# Patient Record
Sex: Male | Born: 1950
Health system: Southern US, Community
[De-identification: ages and names within clinical notes are randomized; demographics above are authoritative.]

## PROBLEM LIST (undated history)

## (undated) DIAGNOSIS — N200 Calculus of kidney: Secondary | ICD-10-CM

## (undated) DIAGNOSIS — I2510 Atherosclerotic heart disease of native coronary artery without angina pectoris: Secondary | ICD-10-CM

## (undated) DIAGNOSIS — Q2381 Bicuspid aortic valve: Secondary | ICD-10-CM

## (undated) DIAGNOSIS — R972 Elevated prostate specific antigen [PSA]: Secondary | ICD-10-CM

## (undated) DIAGNOSIS — E785 Hyperlipidemia, unspecified: Secondary | ICD-10-CM

## (undated) DIAGNOSIS — I10 Essential (primary) hypertension: Secondary | ICD-10-CM

## (undated) DIAGNOSIS — H269 Unspecified cataract: Secondary | ICD-10-CM

## (undated) HISTORY — DX: Hyperlipidemia, unspecified: E78.5

## (undated) HISTORY — DX: Elevated prostate specific antigen (PSA): R97.20

## (undated) HISTORY — PX: TONSILLECTOMY: SUR1361

## (undated) HISTORY — DX: Atherosclerotic heart disease of native coronary artery without angina pectoris: I25.10

## (undated) HISTORY — PX: EYE SURGERY: SHX253

## (undated) HISTORY — DX: Unspecified cataract: H26.9

## (undated) HISTORY — DX: Calculus of kidney: N20.0

## (undated) HISTORY — DX: Essential (primary) hypertension: I10

## (undated) HISTORY — DX: Bicuspid aortic valve: Q23.81

---

## 2003-12-26 ENCOUNTER — Inpatient Hospital Stay (HOSPITAL_BASED_OUTPATIENT_CLINIC_OR_DEPARTMENT_OTHER): Admission: RE | Admit: 2003-12-26 | Discharge: 2003-12-26 | Payer: Self-pay | Admitting: Cardiovascular Disease

## 2009-01-01 ENCOUNTER — Ambulatory Visit: Payer: Self-pay | Admitting: Family Medicine

## 2009-01-01 DIAGNOSIS — E785 Hyperlipidemia, unspecified: Secondary | ICD-10-CM

## 2009-01-01 DIAGNOSIS — I2510 Atherosclerotic heart disease of native coronary artery without angina pectoris: Secondary | ICD-10-CM | POA: Insufficient documentation

## 2009-01-01 DIAGNOSIS — R972 Elevated prostate specific antigen [PSA]: Secondary | ICD-10-CM

## 2009-01-01 HISTORY — DX: Elevated prostate specific antigen (PSA): R97.20

## 2009-01-01 HISTORY — DX: Hyperlipidemia, unspecified: E78.5

## 2009-01-02 LAB — CONVERTED CEMR LAB
ALT: 28 units/L (ref 0–53)
GFR calc non Af Amer: 92.76 mL/min (ref >60)
Glucose, Bld: 95 mg/dL (ref 70–99)
HDL: 27.8 mg/dL — ABNORMAL LOW (ref >39.00)
Potassium: 5.1 meq/L (ref 3.5–5.1)
Sodium: 141 meq/L (ref 135–145)
Total Bilirubin: 1.4 mg/dL — ABNORMAL HIGH (ref 0.3–1.2)
Total CHOL/HDL Ratio: 4
VLDL: 25 mg/dL (ref 0.0–40.0)

## 2009-12-26 ENCOUNTER — Ambulatory Visit: Payer: Self-pay | Admitting: Family Medicine

## 2009-12-26 LAB — CONVERTED CEMR LAB
BUN: 20 mg/dL (ref 6–23)
Bilirubin Urine: NEGATIVE
Bilirubin, Direct: 0.1 mg/dL (ref 0.0–0.3)
CO2: 27 meq/L (ref 19–32)
Chloride: 102 meq/L (ref 96–112)
Cholesterol: 130 mg/dL (ref 0–200)
Creatinine, Ser: 1 mg/dL (ref 0.4–1.5)
Eosinophils Absolute: 0.3 10*3/uL (ref 0.0–0.7)
Glucose, Bld: 101 mg/dL — ABNORMAL HIGH (ref 70–99)
Ketones, urine, test strip: NEGATIVE
LDL Cholesterol: 71 mg/dL (ref 0–99)
MCHC: 34.5 g/dL (ref 30.0–36.0)
MCV: 92.7 fL (ref 78.0–100.0)
Monocytes Absolute: 0.6 10*3/uL (ref 0.1–1.0)
Neutrophils Relative %: 72.8 % (ref 43.0–77.0)
PSA: 3.99 ng/mL (ref 0.10–4.00)
Platelets: 173 10*3/uL (ref 150.0–400.0)
RDW: 12.8 % (ref 11.5–14.6)
Specific Gravity, Urine: 1.02
TSH: 2.14 microintl units/mL (ref 0.35–5.50)
Total Bilirubin: 0.5 mg/dL (ref 0.3–1.2)
Triglycerides: 127 mg/dL (ref 0.0–149.0)
Urobilinogen, UA: 0.2

## 2010-01-02 ENCOUNTER — Ambulatory Visit: Payer: Self-pay | Admitting: Family Medicine

## 2010-01-03 ENCOUNTER — Telehealth: Payer: Self-pay | Admitting: Family Medicine

## 2010-01-21 ENCOUNTER — Encounter: Payer: Self-pay | Admitting: Family Medicine

## 2010-01-30 ENCOUNTER — Encounter: Payer: Self-pay | Admitting: Gastroenterology

## 2010-02-28 ENCOUNTER — Encounter (INDEPENDENT_AMBULATORY_CARE_PROVIDER_SITE_OTHER): Payer: Self-pay | Admitting: *Deleted

## 2010-03-03 ENCOUNTER — Ambulatory Visit: Payer: Self-pay | Admitting: Gastroenterology

## 2010-03-10 LAB — HM COLONOSCOPY

## 2010-03-17 ENCOUNTER — Ambulatory Visit: Payer: Self-pay | Admitting: Gastroenterology

## 2010-06-25 NOTE — Letter (Signed)
Summary: [facility] Instructions  [facility]  [address] [place], [place] [address]   Phone: [phone]  Fax: [phone]       Eugene Daniel    August 07, 1950    MRN: [id number]        Procedure Day Dorna Bloom:  Duanne Limerick  03/17/10     Arrival Time:  9:30AM     Procedure Time:  10:30AM     Location of Procedure:                    _X _  [facility] (4th Floor)                       PREPARATION FOR COLONOSCOPY WITH MOVIPREP   Starting 5 days prior to your procedure 03/12/10 do not eat nuts, seeds, popcorn, corn, beans, peas,  salads, or any raw vegetables.  Do not take any fiber supplements (e.g. Metamucil, Citrucel, and Benefiber).  THE DAY BEFORE YOUR PROCEDURE         DATE: 03/16/10  DAY: SUNDAY  1.  Drink clear liquids the entire day-NO SOLID FOOD  2.  Do not drink anything colored red or purple.  Avoid juices with pulp.  No orange juice.  3.  Drink at least 64 oz. (8 glasses) of fluid/clear liquids during the day to prevent dehydration and help the prep work efficiently.  CLEAR LIQUIDS INCLUDE: Water Jello Ice Popsicles Tea (sugar ok, no milk/cream) Powdered fruit flavored drinks Coffee (sugar ok, no milk/cream) Gatorade Juice: apple, white grape, white cranberry  Lemonade Clear bullion, consomm, broth Carbonated beverages (any kind) Strained chicken noodle soup Hard Candy                             4.  In the morning, mix first dose of MoviPrep solution:    Empty 1 Pouch A and 1 Pouch B into the disposable container    Add lukewarm drinking water to the top line of the container. Mix to dissolve    Refrigerate (mixed solution should be used within 24 hrs)  5.  Begin drinking the prep at 5:00 p.m. The MoviPrep container is divided by 4 marks.   Every 15 minutes drink the solution down to the next mark (approximately 8 oz) until the full liter is complete.   6.  Follow completed prep with 16 oz of clear liquid of your choice  (Nothing red or purple).  Continue to drink clear liquids until bedtime.  7.  Before going to bed, mix second dose of MoviPrep solution:    Empty 1 Pouch A and 1 Pouch B into the disposable container    Add lukewarm drinking water to the top line of the container. Mix to dissolve    Refrigerate  THE DAY OF YOUR PROCEDURE      DATE: 03/17/10  DAY: MONDAY  Beginning at 5:30AM (5 hours before procedure):         1. Every 15 minutes, drink the solution down to the next mark (approx 8 oz) until the full liter is complete.  2. Follow completed prep with 16 oz. of clear liquid of your choice.    3. You may drink clear liquids until 8:30AM (2 HOURS BEFORE PROCEDURE).   MEDICATION INSTRUCTIONS  Unless otherwise instructed, you should take regular prescription medications with a small sip of water   as early as possible the morning of  your procedure.    Additional medication instructions: n/a         OTHER INSTRUCTIONS  You will need a responsible adult at least 58 years of age to accompany you and drive you home.   This person must remain in the waiting room during your procedure.  Wear loose fitting clothing that is easily removed.  Leave jewelry and other valuables at home.  However, you may wish to bring a book to read or  an iPod/MP3 player to listen to music as you wait for your procedure to start.  Remove all body piercing jewelry and leave at home.  Total time from sign-in until discharge is approximately 2-3 hours.  You should go home directly after your procedure and rest.  You can resume normal activities the  day after your procedure.  The day of your procedure you should not:   Drive   Make legal decisions   Operate machinery   Drink alcohol   Return to work  You will receive specific instructions about eating, activities and medications before you leave.    The above instructions have been reviewed and explained to me by   Sherren Kerns RN   March 03, 2010 3:24 PM    I fully understand and can verbalize these instructions _____________________________ Date _________

## 2010-06-25 NOTE — Letter (Signed)
Summary: Pre Visit Letter Revised  [facility]  [address] [place], [place] [address]   Phone: [phone]  Fax: [phone]        01/30/2010 MRN: [id number] Eugene Daniel [address] [place], [place]  [address]             Procedure Date:  Oct 24 at 10:30am   Welcome to the Gastroenterology Division at [facility].    You are scheduled to see a nurse for your pre-procedure visit on Mar 03, 2010 at 3pm on the 3rd floor at [facility], [address]  We ask that you try to arrive at our office 15 minutes prior to your appointment time to allow for check-in.  Please take a minute to review the attached form.  If you answer "Yes" to one or more of the questions on the first page, we ask that you call the person listed at your earliest opportunity.  If you answer "No" to all of the questions, please complete the rest of the form and bring it to your appointment.    Your nurse visit will consist of discussing your medical and surgical history, your immediate family medical history, and your medications.   If you are unable to list all of your medications on the form, please bring the medication bottles to your appointment and we will list them.  We will need to be aware of both prescribed and over the counter drugs.  We will need to know exact dosage information as well.    Please be prepared to read and sign documents such as consent forms, a financial agreement, and acknowledgement forms.  If necessary, and with your consent, a friend or relative is welcome to sit-in on the nurse visit with you.  Please bring your insurance card so that we may make a copy of it.  If your insurance requires a referral to see a specialist, please bring your referral form from your primary care physician.  No co-pay is required for this nurse visit.     If you cannot keep your appointment, please call [phone] to cancel or reschedule prior to your appointment date.  This  allows [place] the opportunity to schedule an appointment for another patient in need of care.    Thank you for choosing  Gastroenterology for your medical needs.  We appreciate the opportunity to care for you.  Please visit [place] at our website  to learn more about our practice.  Sincerely, The Gastroenterology Division

## 2010-06-25 NOTE — Progress Notes (Signed)
Summary: 27 DAY RX  MAILORDER  Phone Note Refill Request Call back at [phone] Message from:  Patient  Refills Requested: Medication #1:  TOPROL XL 50 MG XR24H-TAB once daily  Medication #2:  VYTORIN 10-20 MG TABS once daily. PT NEEDS 90 DAY SUPPLY WITH 3 REFILLS FOR MAIL ORDER. PT WILL PICK UP RXS PLEASE CALL PT WHEN READY FOR PICK UP.  Initial call taken by: Heron Sabins,  January 03, 2010 9:46 AM  Follow-up for Phone Call        okay to reprint for 90 days supply with Refills? Follow-up by: Duard Brady LPN,  January 03, 2010 10:14 AM  Additional Follow-up for Phone Call Additional follow up Details #1::        Yes. Additional Follow-up by: Evelena Peat MD,  January 03, 2010 1:09 PM    Additional Follow-up for Phone Call Additional follow up Details #2::    printed new 90 days rx's - pt aware ready for pick up. KIK Follow-up by: Duard Brady LPN,  January 03, 2010 1:37 PM  Prescriptions: VYTORIN 10-20 MG TABS (EZETIMIBE-SIMVASTATIN) once daily  #90 x 3   Entered by:   Duard Brady LPN   Authorized by:   Evelena Peat MD   Signed by:   Duard Brady LPN on 45/40/9811   Method used:   Print then Give to Patient   RxID:   [id number] TOPROL XL 50 MG XR24H-TAB (METOPROLOL SUCCINATE) once daily  #90 x 3   Entered by:   Duard Brady LPN   Authorized by:   Evelena Peat MD   Signed by:   Duard Brady LPN on 78/46/9629   Method used:   Print then Give to Patient   RxID:   [id number]

## 2010-06-25 NOTE — Miscellaneous (Signed)
Summary: previsit prep/rm  Clinical Lists Changes  Medications: Added new medication of MOVIPREP 100 GM  SOLR (PEG-KCL-NACL-NASULF-NA ASC-C) As per prep instructions. - Signed Rx of MOVIPREP 100 GM  SOLR (PEG-KCL-NACL-NASULF-NA ASC-C) As per prep instructions.;  #1 x 0;  Signed;  Entered by: Sherren Kerns RN;  Authorized by: Mardella Layman MD [facility];  Method used: [phone] [address], [place], [place], [place]  [address], Ph: [phone] or [phone], Fax: [phone] Observations: Added new observation of ALLERGY REV: Done (03/03/2010 14:48)    Prescriptions: MOVIPREP 100 GM  SOLR (PEG-KCL-NACL-NASULF-NA ASC-C) As per prep instructions.  #1 x 0   Entered by:   Sherren Kerns RN   Authorized by:   Mardella Layman MD [facility]   Signed by:   Sherren Kerns RN on 03/03/2010   Method used:   [facility]  [address] [phone][facility]ail[facility]       [address]       [place], [place]  [address]       Ph: [phone] or [phone]       Fax: [phone]   RxID:   [id number]

## 2010-06-25 NOTE — Consult Note (Signed)
Summary: [facility]   Imported By: Sherian Rein 01/29/2010 10:41:35  _____________________________________________________________________  External Attachment:    Type:   Image     Comment:   External Document

## 2010-06-25 NOTE — Assessment & Plan Note (Signed)
Summary: cpx/cjr   Vital Signs:  Patient profile:   60 year old male Height:      71.75 inches Weight:      210 pounds BMI:     28.78 Temp:     98.6 degrees F oral Pulse rate:   78 / minute BP sitting:   110 / 70  (left arm) Cuff size:   regular  Vitals Entered By: Romualdo Bolk, CMA (AAMA) (January 02, 2010 1:22 PM)  Nutrition Counseling: Patient's BMI is greater than 25 and therefore counseled on weight management options. CC: CPX   History of Present Illness: Here for CPE.  Hx of CAD.  NO recent chest pain.  Not exercising. Unfortunately, still smoking some but less than 10 cigarettes/day. Last tetanus unknown.  Cannot confirm prior pneumovax. Had colonoscopy prior to move here but about 10 years ago-he does not recall any abnormality.   Preventive Screening-Counseling & Management  Alcohol-Tobacco     Alcohol drinks/day: 1     Alcohol type: spirits     Smoking Status: current     Packs/Day: 0.25  Caffeine-Diet-Exercise     Caffeine use/day: 5     Does Patient Exercise: no  Clinical Review Panels:  Prevention   Last PSA:  3.99 (12/26/2009)  Lipid Management   Cholesterol:  130 (12/26/2009)   LDL (bad choesterol):  71 (12/26/2009)   HDL (good cholesterol):  33.90 (12/26/2009)  CBC   WBC:  9.9 (12/26/2009)   RBC:  4.85 (12/26/2009)   Hgb:  15.5 (12/26/2009)   Hct:  45.0 (12/26/2009)   Platelets:  173.0 (12/26/2009)   MCV  92.7 (12/26/2009)   MCHC  34.5 (12/26/2009)   RDW  12.8 (12/26/2009)   PMN:  72.8 (12/26/2009)   Lymphs:  17.8 (12/26/2009)   Monos:  6.0 (12/26/2009)   Eosinophils:  2.9 (12/26/2009)   Basophil:  0.5 (12/26/2009)  Complete Metabolic Panel   Glucose:  101 (12/26/2009)   Sodium:  138 (12/26/2009)   Potassium:  4.5 (12/26/2009)   Chloride:  102 (12/26/2009)   CO2:  27 (12/26/2009)   BUN:  20 (12/26/2009)   Creatinine:  1.0 (12/26/2009)   Albumin:  4.1 (12/26/2009)   Total Protein:  6.8 (12/26/2009)   Calcium:  9.2  (12/26/2009)   Total Bili:  0.5 (12/26/2009)   Alk Phos:  59 (12/26/2009)   SGPT (ALT):  28 (12/26/2009)   SGOT (AST):  30 (12/26/2009)   Current Medications (verified): 1)  Toprol Xl 50 Mg Xr24h-Tab (Metoprolol Succinate) .... Once Daily 2)  Vytorin 10-20 Mg Tabs (Ezetimibe-Simvastatin) .... Once Daily  Allergies (verified): No Known Drug Allergies  Past History:  Past Medical History: Last updated: 01/01/2009 Kidney stones Coronary artery disease Hyperlipidemia  Past Surgical History: Last updated: 01/01/2009 Tonsillectomy  Family History: Last updated: 01/01/2009 CAD Father and grandfather 55 Grandmother leukemia  Social History: Last updated: 01/01/2009 Occupation: Married Alcohol use-yes Current Smoker  Risk Factors: Alcohol Use: 1 (01/02/2010) Caffeine Use: 5 (01/02/2010) Exercise: no (01/02/2010)  Risk Factors: Smoking Status: current (01/02/2010) Packs/Day: 0.25 (01/02/2010) PMH-FH-SH reviewed for relevance  Social History: Packs/Day:  0.25 Caffeine use/day:  5 Does Patient Exercise:  no  Review of Systems  The patient denies anorexia, fever, weight loss, weight gain, vision loss, chest pain, syncope, dyspnea on exertion, peripheral edema, prolonged cough, headaches, hemoptysis, abdominal pain, melena, hematochezia, severe indigestion/heartburn, hematuria, incontinence, muscle weakness, suspicious skin lesions, depression, enlarged lymph nodes, and testicular masses.    Physical Exam  General:  Well-developed,well-nourished,in no acute distress; alert,appropriate and cooperative throughout examination Head:  Normocephalic and atraumatic without obvious abnormalities. No apparent alopecia or balding. Eyes:  pupils equal, pupils round, and pupils reactive to light.   Ears:  External ear exam shows no significant lesions or deformities.  Otoscopic examination reveals clear canals, tympanic membranes are intact bilaterally without bulging, retraction,  inflammation or discharge. Hearing is grossly normal bilaterally. Mouth:  Oral mucosa and oropharynx without lesions or exudates.  Teeth in good repair. Neck:  No deformities, masses, or tenderness noted. Lungs:  Normal respiratory effort, chest expands symmetrically. Lungs are clear to auscultation, no crackles or wheezes. Heart:  normal rate, regular rhythm, and no gallop.   Abdomen:  soft, non-tender, normal bowel sounds, no distention, no masses, no abdominal hernia, no hepatomegaly, and no splenomegaly.   Rectal:  No external abnormalities noted. Normal sphincter tone. No rectal masses or tenderness. Genitalia:  Testes bilaterally descended without nodularity, tenderness or masses. No scrotal masses or lesions. No penis lesions or urethral discharge. Prostate:  Prostate gland firm and smooth, no enlargement, nodularity, tenderness, mass, asymmetry or induration. Msk:  No deformity or scoliosis noted of thoracic or lumbar spine.   Extremities:  No clubbing, cyanosis, edema, or deformity noted with normal full range of motion of all joints.   Neurologic:  alert & oriented X3, cranial nerves II-XII intact, strength normal in all extremities, and gait normal.   Skin:  no rashes and no suspicious lesions.   Cervical Nodes:  No lymphadenopathy noted Psych:  normally interactive, good eye contact, not anxious appearing, and not depressed appearing.     Impression & Recommendations:  Problem # 1:  Preventive Health Care (ICD-V70.0) Needs to quit smoking.  Labs reviewed.  Needs more consistent exercise.  Refer for repeat colonoscopy. Tdap and pneumovax given.  Problem # 2:  ELEVATED PROSTATE SPECIFIC ANTIGEN (ICD-790.93)  1.37 last year to current value of 3.99.  No nodules palpated.  Rec urology referral and pt agrees.  Orders: Urology Referral (Urology)  Complete Medication List: 1)  Toprol Xl 50 Mg Xr24h-tab (Metoprolol succinate) .... Once daily 2)  Vytorin 10-20 Mg Tabs  (Ezetimibe-simvastatin) .... Once daily  Other Orders: Tdap => 19yrs IM ([address]147) Pneumococcal Vaccine (82956) Admin 1st Vaccine (21308) Admin of Any Addtl Vaccine (65784) Gastroenterology Referral (GI)  Patient Instructions: 1)  Stop smoking tips: Choose a quit date. Cut down before the quit date. Decide what you will do as a substitute when you feel the urge to smoke(gum, toothpick, exercise).  2)  It is important that you exercise reguarly at least 20 minutes 5 times a week. If you develop chest pain, have severe difficulty breathing, or feel very tired, stop exercising immediately and seek medical attention.  Prescriptions: VYTORIN 10-20 MG TABS (EZETIMIBE-SIMVASTATIN) once daily  #30 x 11   Entered and Authorized by:   Evelena Peat MD   Signed by:   Evelena Peat MD on 01/02/2010   Method used:   [facility]  [address] [phone][facility]       [address]       [place], [place]  [address]       Ph: [phone] or [phone]       Fax: [phone]   RxID:   [id number] TOPROL XL 50 MG XR24H-TAB (METOPROLOL SUCCINATE) once daily  #30 x 11   Entered and Authorized by:  Evelena Peat MD   Signed by:   Evelena Peat MD on 01/02/2010   Method used:   [facility]        [facility]  [address] [phone][facility]       [address] [place], [place]  [address]       Ph: [phone] or [phone]       Fax: [phone]   RxID:   [id number]    Immunizations Administered:  Tetanus Vaccine:    Vaccine Type: Tdap    Site: left deltoid    Mfr: GlaxoSmithKline    Dose: 0.5 ml    Route: IM    Given by: Romualdo Bolk, CMA (AAMA)    Exp. Date: 11/22/2011    Lot #: [id number]  Pneumonia Vaccine:    Vaccine Type: Pneumovax    Site: right deltoid    Mfr: Merck    Dose: 0.5 ml    Route: IM    Given by: Romualdo Bolk, CMA (AAMA)    Exp. Date: 05/24/2011    Lot #: [id number]

## 2010-06-25 NOTE — Procedures (Signed)
Summary: Colonoscopy  Patient: Eugene Daniel Note: All result statuses are Final unless otherwise noted.  Tests: (1) Colonoscopy (COL)   COL Colonoscopy           DONE     [facility]     [address]     [place], [place]  [address]           COLONOSCOPY PROCEDURE REPORT           PATIENT:  Eugene, Daniel  MR#:  [id number]     BIRTHDATE:  07/02/1950, 59 yrs. old  GENDER:  male     ENDOSCOPIST:  Vania Rea. Jarold Motto, MD, [facility]     REF. BY:  Evelena Peat, M.D.     PROCEDURE DATE:  03/17/2010     PROCEDURE:  Average-risk screening colonoscopy     G0121     ASA CLASS:  Class II     INDICATIONS:  Routine Risk Screening     MEDICATIONS:   Fentanyl 75 mcg IV, Versed 7 mg IV           DESCRIPTION OF PROCEDURE:   After the risks benefits and     alternatives of the procedure were thoroughly explained, informed     consent was obtained.  Digital rectal exam was performed and     revealed no abnormalities.   The LB 180AL [id number] endoscope was     introduced through the anus and advanced to the cecum, which was     identified by both the appendix and ileocecal valve, without     limitations.  The quality of the prep was poor, using MoviPrep.     The instrument was then slowly withdrawn as the colon was fully     examined.     <<PROCEDUREIMAGES>>           FINDINGS:  Severe diverticulosis was found in the sigmoid to     descending colon segments.  No polyps or cancers were seen.  This     was otherwise a normal examination of the colon.   Retroflexed     views in the rectum revealed no abnormalities.    The scope was     then withdrawn from the patient and the procedure completed.           COMPLICATIONS:  None     ENDOSCOPIC IMPRESSION:     1) Severe diverticulosis in the sigmoid to descending colon     segments     2) No polyps or cancers     3) Otherwise normal examination     RECOMMENDATIONS:     1) Continue current colorectal screening recommendations for  "routine risk" patients with a repeat colonoscopy in 10 years.     REPEAT EXAM:  No           ______________________________     Vania Rea. Jarold Motto, MD, [facility]           CC:           n.     eSIGNED:   Vania Rea. Patterson at 03/17/2010 10:53 AM           Wyatt Mage, [id number]  Note: An exclamation mark (!) indicates a result that was not dispersed into the flowsheet. Document Creation Date: 03/17/2010 10:53 AM _______________________________________________________________________  (1) Order result status: Final Collection or observation date-time: 03/17/2010 10:45 Requested date-time:  Receipt date-time:  Reported date-time:  Referring Physician:   Ordering Physician: Sheryn Bison [phone])  Specimen Source:  Source: Launa Grill Order Number: [id number] Lab site:   Appended Document: Colonoscopy    Clinical Lists Changes  Observations: Added new observation of COLONNXTDUE: 02/2020 (03/17/2010 13:47)

## 2010-10-10 NOTE — Cardiovascular Report (Signed)
NAMEJULIA, KULZER                         ACCOUNT NO.:  [id number]   MEDICAL RECORD NO.:  [id number]                   PATIENT TYPE:  OIB   LOCATION:  6501                                 FACILITY:  [facility]   PHYSICIAN:  Vesta Mixer, M.D.              DATE OF BIRTH:  [birth year]   DATE OF PROCEDURE:  12/26/2003  DATE OF DISCHARGE:                              CARDIAC CATHETERIZATION   Mr. Bures is a 60 year old gentleman with a history of heart murmur.  He  was found to have aortic insufficiency as well as aortic stenosis.  He was  found to have a small area of akinesis in the distal septal wall.  He was  referred for outpatient heart catheterization on this basis.   PROCEDURE:  Left heart catheterization with coronary angiography and  aortography.   The right femoral artery was cannulated using the modified Seldinger  technique using a 4 French sheath.  4 French catheters were used throughout  this diagnostic outpatient catheterization.   HEMODYNAMICS:  The left ventricular pressure was 123/9 with an aortic  pressure of 122/66.   CORONARY ANGIOGRAPHY:  1. Left main is smooth and normal.  2. The left anterior descending artery starts off as a very large vessel.     It gives off several very large diagonal branches which are actually much     larger than the LAD itself.  The mid LAD has an area of haziness which     probably represents a 75% stenosis.  The lesion is actually quite hazy,     but the lumen dimensions are not all that different than the lumen on     either side of the stenosis.  The remainder of the LAD has only minor     luminal irregularities.  3. The left anterior descending artery at the site of this lesion is     approximately 1.5 mm in size and is just barely bigger than the 1.4     catheter used for angiography.  The diagonal vessels are all smooth and     normal and are fairly large between 2.5 and 3 mm in diameter.  4. The left circumflex artery is  a moderate size vessel.  It is fairly     smooth and normal throughout its course.  There is a moderate size     marginal branch which is normal.  5. The right coronary artery is moderate size vessel.  It is smooth and is     dominant.  The posterior descending artery and several posterior lateral     branches are normal.   LEFT VENTRICULOGRAM:  Left ventriculogram was performed in a 30-RAO  position.  It reveals mildly depressed left ventricular systolic function.  There is an area of akinesis on the distal septum.  The remainder of the  ventricle contracts fairly well and ejection fraction is approximately 45-  50%.  An aortogram was performed.  It reveals 2+ aortic insufficiency.  The left  ventricle dimension appears to be fairly well preserved.   COMPLICATIONS:  None.   CONCLUSIONS:  1. Significant disease involving the mid left anterior descending.  The left     anterior descending is quite small at this site and by Cardiolite     scanning it appears that he has already had an infarct at this area.  The     vessel lumen is quite small and I do not think this vessel is a candidate     for stenting.  We will need to continue with medical therapy.  2. Aortic insufficiency.  He has at least moderate and perhaps severe aortic     insufficiency.  His left ventricular size is still within normal or     perhaps just above normal range.  It clearly is not dilated enough to     warrant aortic valve replacement on the basis of aortic insufficiency.     We will need to keep a close eye on this using echo.  The patient will be     continued on medical therapy and we will follow up as an outpatient.                                               Vesta Mixer, M.D.    PJN/MEDQ  D:  12/26/2003  T:  12/26/2003  Job:  [id number]   cc:   Evelena Peat, M.D.  [address]  [place]  [place] [address]  Fax: [phone]

## 2010-10-10 NOTE — H&P (Signed)
NAMEJONA, ZAPPONE                         ACCOUNT NO.:  [id number]   MEDICAL RECORD NO.:  [id number]                   PATIENT TYPE:  AMB   LOCATION:                                       FACILITY:  [facility]   PHYSICIAN:  Vesta Mixer, M.D.              DATE OF BIRTH:  1951/02/09   DATE OF ADMISSION:  12/26/2003  DATE OF DISCHARGE:                                HISTORY & PHYSICAL   Mr. Rodda is a 60 year old gentleman with a history of heart murmur.  He  recently had an echocardiogram which revealed aortic insufficiency and mild  aortic stenosis. He also had a focal area of akinesis on the anterior wall.  We had him return to the office for further evaluation of these problems.   Mr. Tendler has had a heart murmur for several years. He has not had any  specific cardiac problems. He has been able to work hard and do all of his  normal activities without any significant problems. He was recently told  that he had a heart murmur, but has never been told that he had a heart  attack. He has never had any episodes of prolonged chest pain that would  suggest a heart attack.   CURRENT MEDICATIONS:  None.   ALLERGIES:  None.   PAST MEDICAL HISTORY:  None.   SOCIAL HISTORY:  The patient used to smoke, but quit several months ago. He  works in Insurance account manager. He drinks alcohol occasionally.   FAMILY HISTORY:  Negative for coronary artery disease.   REVIEW OF SYSTEMS:  Reviewed and essentially negative.   PHYSICAL EXAMINATION:  GENERAL: He is a middle-age gentleman in no acute  distress. He is alert and oriented times three and his mood and affect are  normal.  VITAL SIGNS: His weight is 207, blood pressure 120/80 with heart rate of 72.  HEENT: 2+ carotids.  He has no bruits. There is no JVD or thyromegaly.  LUNGS: Clear.  HEART: Regular rate; S1 and S2. He has a 2-3/6 systolic ejection murmur at  the left sternal border. He has a very soft diastolic murmur.  ABDOMEN: Good bowel  sounds and nontender.  EXTREMITIES: No clubbing, cyanosis, or edema.  NEUROLOGIC: Nonfocal.   Mr. Hollabaugh presents with an aortic insufficiency on echocardiogram as well  as mid and distal anteroseptal akinesis. It appears that he may have had a  previous anteroseptal myocardial infarction.  He does have Q waves in leads  V1 and  V2 on his resting EKG and he may have in fact had a previous anteroseptal  myocardial infarction.  I have recommended that we proceed with heart  catheterization. We discussed the risks, benefits, and options of heart  catheterization. He understands and agrees to proceed.  Vesta Mixer, M.D.    PJN/MEDQ  D:  12/19/2003  T:  12/19/2003  Job:  [id number]   cc:   Evelena Peat, M.D.  [address]  [place]  [place] [address]  Fax: [phone]

## 2010-12-31 ENCOUNTER — Other Ambulatory Visit (INDEPENDENT_AMBULATORY_CARE_PROVIDER_SITE_OTHER): Payer: Self-pay

## 2010-12-31 ENCOUNTER — Other Ambulatory Visit: Payer: Self-pay | Admitting: Family Medicine

## 2010-12-31 DIAGNOSIS — Z0000 Encounter for general adult medical examination without abnormal findings: Secondary | ICD-10-CM

## 2010-12-31 LAB — BASIC METABOLIC PANEL
CO2: 28 mEq/L (ref 19–32)
Calcium: 9.1 mg/dL (ref 8.4–10.5)
Chloride: 107 mEq/L (ref 96–112)
Sodium: 141 mEq/L (ref 135–145)

## 2010-12-31 LAB — HEPATIC FUNCTION PANEL
ALT: 19 U/L (ref 0–53)
AST: 17 U/L (ref 0–37)
Albumin: 3.9 g/dL (ref 3.5–5.2)
Alkaline Phosphatase: 50 U/L (ref 39–117)
Total Protein: 6.6 g/dL (ref 6.0–8.3)

## 2010-12-31 LAB — CBC WITH DIFFERENTIAL/PLATELET
Basophils Relative: 0.6 % (ref 0.0–3.0)
Eosinophils Relative: 4.8 % (ref 0.0–5.0)
HCT: 45.6 % (ref 39.0–52.0)
Hemoglobin: 15.5 g/dL (ref 13.0–17.0)
Lymphs Abs: 1.5 10*3/uL (ref 0.7–4.0)
MCV: 92.3 fl (ref 78.0–100.0)
Monocytes Absolute: 0.5 10*3/uL (ref 0.1–1.0)
Monocytes Relative: 6.4 % (ref 3.0–12.0)
Neutro Abs: 5.6 10*3/uL (ref 1.4–7.7)

## 2010-12-31 LAB — LIPID PANEL
HDL: 31.4 mg/dL — ABNORMAL LOW (ref >39.00)
Total CHOL/HDL Ratio: 4
Triglycerides: 141 mg/dL (ref 0.0–149.0)

## 2010-12-31 LAB — URINALYSIS
Bilirubin Urine: NEGATIVE
Ketones, ur: NEGATIVE
Leukocytes, UA: NEGATIVE
pH: 6 (ref 5.0–8.0)

## 2011-01-07 ENCOUNTER — Encounter: Payer: Self-pay | Admitting: Family Medicine

## 2011-01-09 ENCOUNTER — Encounter: Payer: Self-pay | Admitting: Family Medicine

## 2011-01-12 ENCOUNTER — Ambulatory Visit (INDEPENDENT_AMBULATORY_CARE_PROVIDER_SITE_OTHER): Payer: Self-pay | Admitting: Family Medicine

## 2011-01-12 ENCOUNTER — Encounter: Payer: Self-pay | Admitting: Family Medicine

## 2011-01-12 VITALS — BP 120/72 | HR 80 | Temp 98.2°F | Resp 12 | Ht 72.0 in | Wt 195.0 lb

## 2011-01-12 DIAGNOSIS — Z0000 Encounter for general adult medical examination without abnormal findings: Secondary | ICD-10-CM

## 2011-01-12 LAB — PSA: PSA: 1.51 ng/mL (ref 0.10–4.00)

## 2011-01-12 MED ORDER — METOPROLOL SUCCINATE ER 50 MG PO TB24: 50.0000 mg | ORAL_TABLET | Freq: Every day | ORAL | Status: DC

## 2011-01-12 MED ORDER — EZETIMIBE-SIMVASTATIN 10-20 MG PO TABS: 1.0000 | ORAL_TABLET | Freq: Every day | ORAL | Status: DC

## 2011-01-12 NOTE — Progress Notes (Signed)
Subjective:    Patient ID: Eugene Daniel, male    DOB: [birth year], 60 y.o.   MRN: [id number]  HPI Patient here for complete physical and medical followup.  History of CAD with MI approximately 4 years ago. No recent cardiology followup. Patient also has hyperlipidemia and prior elevation of PSA which has been worked up by urologist. He does not recall a prior biopsy.  He unfortunately continues to smoke but is trying to scale back. No regular exercise. Works in [employer]). Recently took a new job in [place] and may be moving soon.  Immunizations with Pneumovax and tetanus up-to-date. Previous colonoscopy last year normal. Patient continues to smoke as above.  Patient remains on Vytorin 10/20 one daily. Metoprolol 50 mg daily. Blood pressure stable. Patient denies any recent chest pains or dizziness.  Past Medical History  Diagnosis Date  . HYPERLIPIDEMIA 01/01/2009  . CORONARY ARTERY DISEASE 01/01/2009  . ELEVATED PROSTATE SPECIFIC ANTIGEN 01/01/2009  . Kidney stone    Past Surgical History  Procedure Date  . Tonsillectomy     reports that he has been smoking.  He does not have any smokeless tobacco history on file. His alcohol and drug histories not on file. family history includes Heart disease in his father and paternal grandfather and Leukemia in his paternal grandfather. No Known Allergies    Review of Systems  Constitutional: Negative for fever, activity change, appetite change, fatigue and unexpected weight change.  HENT: Negative for ear pain, congestion and trouble swallowing.   Eyes: Negative for pain and visual disturbance.  Respiratory: Negative for cough, shortness of breath and wheezing.   Cardiovascular: Negative for chest pain and palpitations.  Gastrointestinal: Negative for nausea, vomiting, abdominal pain, diarrhea, constipation, blood in stool, abdominal distention and rectal pain.  Genitourinary: Negative for dysuria, hematuria and  testicular pain.  Musculoskeletal: Negative for joint swelling and arthralgias.  Skin: Negative for rash.  Neurological: Negative for dizziness, syncope and headaches.  Hematological: Negative for adenopathy.  Psychiatric/Behavioral: Negative for confusion and dysphoric mood.       Objective:   Physical Exam  Constitutional: He is oriented to person, place, and time. He appears well-developed and well-nourished. No distress.  HENT:  Head: Normocephalic and atraumatic.  Right Ear: External ear normal.  Left Ear: External ear normal.  Mouth/Throat: Oropharynx is clear and moist.  Eyes: Conjunctivae and EOM are normal. Pupils are equal, round, and reactive to light.  Neck: Normal range of motion. Neck supple. No thyromegaly present.  Cardiovascular: Normal rate, regular rhythm and normal heart sounds.   No murmur heard. Pulmonary/Chest: No respiratory distress. He has no wheezes. He has no rales.  Abdominal: Soft. Bowel sounds are normal. He exhibits no distension and no mass. There is no tenderness. There is no rebound and no guarding.  Musculoskeletal: He exhibits no edema.  Lymphadenopathy:    He has no cervical adenopathy.  Neurological: He is alert and oriented to person, place, and time. He displays normal reflexes. No cranial nerve deficit.  Skin: No rash noted.       Sebaceous type cyst posterior neck just right of midline. No fluctuance. Nontender. No erythema.  Psychiatric: He has a normal mood and affect.          Assessment & Plan:  #1 health maintenance. Repeat PSA which was left off of labs. Other labs reviewed with patient. Immunizations up to date. Continue yearly flu vaccine. Colonoscopy up to date. #2 history of CAD. Patient  really should follow up with cardiology given several years since evaluated. At this point he is in transition with possible move.  LDL at goal. Patient strongly advised to stop smoking #3 history of elevated PSA. Reevaluate as above

## 2011-01-13 NOTE — Progress Notes (Signed)
Quick Note:  Pt informed ______ 

## 2011-05-13 ENCOUNTER — Ambulatory Visit (INDEPENDENT_AMBULATORY_CARE_PROVIDER_SITE_OTHER): Payer: BC Managed Care – PPO | Admitting: Family Medicine

## 2011-05-13 ENCOUNTER — Encounter: Payer: Self-pay | Admitting: Family Medicine

## 2011-05-13 ENCOUNTER — Ambulatory Visit
Admission: RE | Admit: 2011-05-13 | Discharge: 2011-05-13 | Disposition: A | Discharge status: Home / Self Care | Payer: BC Managed Care – PPO | Source: Ambulatory Visit | Attending: Family Medicine | Admitting: Family Medicine

## 2011-05-13 ENCOUNTER — Ambulatory Visit (INDEPENDENT_AMBULATORY_CARE_PROVIDER_SITE_OTHER)
Admission: RE | Admit: 2011-05-13 | Discharge: 2011-05-13 | Disposition: A | Discharge status: Home / Self Care | Payer: BC Managed Care – PPO | Source: Ambulatory Visit | Attending: Family Medicine | Admitting: Family Medicine

## 2011-05-13 VITALS — BP 122/72 | Temp 97.5°F | Wt 197.0 lb

## 2011-05-13 DIAGNOSIS — S99922A Unspecified injury of left foot, initial encounter: Secondary | ICD-10-CM

## 2011-05-13 DIAGNOSIS — S9030XA Contusion of unspecified foot, initial encounter: Secondary | ICD-10-CM

## 2011-05-13 DIAGNOSIS — S99929A Unspecified injury of unspecified foot, initial encounter: Secondary | ICD-10-CM

## 2011-05-13 MED ORDER — CEPHALEXIN 500 MG PO CAPS: 500.0000 mg | ORAL_CAPSULE | Freq: Three times a day (TID) | ORAL | Status: AC

## 2011-05-13 NOTE — Progress Notes (Signed)
  Subjective:    Patient ID: Eugene Daniel, male    DOB: 1950-09-28, 60 y.o.   MRN: [id number]  HPI  Patient seen with recent injuries to left foot and left fourth toe. One week ago dropped a heavy glass door left fourth toe. Had some immediate swelling. Able to ambulate. Moderate pain since then. Then, 2 days ago patient was climbing over a fence and hit dorsum left foot against a fence post. Has had some diffuse swelling of the foot since that time. Foot pain somewhat poorly localized. Is able to ambulate.   Review of Systems  Constitutional: Negative for fever, chills and fatigue.  Musculoskeletal: Negative for gait problem.       Objective:   Physical Exam  Constitutional: He appears well-developed and well-nourished.  Cardiovascular: Normal rate and regular rhythm.   Pulmonary/Chest: Effort normal and breath sounds normal. No respiratory distress. He has no wheezes. He has no rales.  Musculoskeletal:       Left foot reveals moderate edema dorsally. Minimal warmth. Very minimal erythema. Minimal diffuse tenderness dorsum of foot. Left fourth toe reveals diffuse swelling and ecchymosis. Toenail is slightly disrupted. Left fourth toe diffusely tender to palpation but mostly on the distal aspect. May have some erythema involving the left fourth toe          Assessment & Plan:  Left foot and fourth toe injury following incidents above. Strongly suspicious for left fourth toe fracture. Cannot rule out early cellulitis. Start cephalexin 500 mg 3 times a day particularly with toenail disruption and likely underlying fracture. Foot x-rays to rule out metatarsal fracture

## 2011-06-24 ENCOUNTER — Telehealth: Payer: Self-pay | Admitting: *Deleted

## 2011-06-24 DIAGNOSIS — E785 Hyperlipidemia, unspecified: Secondary | ICD-10-CM

## 2011-06-24 MED ORDER — ATORVASTATIN CALCIUM 20 MG PO TABS: 20.0000 mg | ORAL_TABLET | Freq: Every day | ORAL | Status: DC

## 2011-06-24 NOTE — Telephone Encounter (Signed)
If no intolerance, let's try switching to Lipitor 20 mg daily.  Patient would need lipid and hepatic panel 2 months after making change.

## 2011-06-24 NOTE — Telephone Encounter (Signed)
Pt informed, med and future lab ordered

## 2011-06-24 NOTE — Telephone Encounter (Signed)
[facility] faxed, hei plan does not cover Vytorin any longer.  I spoke with pt, OK with pt to go to a generic

## 2011-10-05 ENCOUNTER — Other Ambulatory Visit (INDEPENDENT_AMBULATORY_CARE_PROVIDER_SITE_OTHER): Payer: BC Managed Care – PPO

## 2011-10-05 DIAGNOSIS — E785 Hyperlipidemia, unspecified: Secondary | ICD-10-CM

## 2011-10-05 LAB — HEPATIC FUNCTION PANEL
ALT: 25 U/L (ref 0–53)
AST: 21 U/L (ref 0–37)
Alkaline Phosphatase: 69 U/L (ref 39–117)
Total Bilirubin: 0.7 mg/dL (ref 0.3–1.2)

## 2012-02-10 ENCOUNTER — Ambulatory Visit (INDEPENDENT_AMBULATORY_CARE_PROVIDER_SITE_OTHER): Payer: BC Managed Care – PPO | Admitting: Family Medicine

## 2012-02-10 ENCOUNTER — Encounter: Payer: Self-pay | Admitting: Family Medicine

## 2012-02-10 VITALS — BP 140/80 | Temp 98.2°F | Wt 194.0 lb

## 2012-02-10 DIAGNOSIS — E785 Hyperlipidemia, unspecified: Secondary | ICD-10-CM

## 2012-02-10 DIAGNOSIS — I10 Essential (primary) hypertension: Secondary | ICD-10-CM

## 2012-02-10 DIAGNOSIS — I2510 Atherosclerotic heart disease of native coronary artery without angina pectoris: Secondary | ICD-10-CM

## 2012-02-10 LAB — BASIC METABOLIC PANEL
BUN: 24 mg/dL — ABNORMAL HIGH (ref 6–23)
CO2: 29 mEq/L (ref 19–32)
Chloride: 106 mEq/L (ref 96–112)
Creatinine, Ser: 1.1 mg/dL (ref 0.4–1.5)
Glucose, Bld: 92 mg/dL (ref 70–99)
Potassium: 5.4 mEq/L — ABNORMAL HIGH (ref 3.5–5.1)

## 2012-02-10 LAB — LDL CHOLESTEROL, DIRECT: Direct LDL: 90.9 mg/dL

## 2012-02-10 LAB — LIPID PANEL: Cholesterol: 156 mg/dL (ref 0–200)

## 2012-02-10 MED ORDER — ATORVASTATIN CALCIUM 20 MG PO TABS: 20.0000 mg | ORAL_TABLET | Freq: Every day | ORAL | Status: DC

## 2012-02-10 MED ORDER — METOPROLOL SUCCINATE ER 50 MG PO TB24: 50.0000 mg | ORAL_TABLET | Freq: Every day | ORAL | Status: DC

## 2012-02-10 NOTE — Progress Notes (Signed)
  Subjective:    Patient ID: Eugene Daniel, male    DOB: 01-Dec-1950, 61 y.o.   MRN: [id number]  HPI  Medical followup. Patient has history of CAD, hyperlipidemia, and borderline elevated blood pressure. We recently changed him to Lipitor and he came in May for labs and apparently only hepatic panel  done without lipid panel. He has not had any side effects. No myalgias. Also takes metoprolol. No recent chest pains. Still smoking and low motivation to quit. No dyspnea. No hemoptysis. No appetite or weight changes. No history of diabetes.  Past Medical History  Diagnosis Date  . HYPERLIPIDEMIA 01/01/2009  . CORONARY ARTERY DISEASE 01/01/2009  . ELEVATED PROSTATE SPECIFIC ANTIGEN 01/01/2009  . Kidney stone    Past Surgical History  Procedure Date  . Tonsillectomy     reports that he has been smoking.  He does not have any smokeless tobacco history on file. His alcohol and drug histories not on file. family history includes Heart disease in his father and paternal grandfather and Leukemia in his paternal grandfather. No Known Allergies    Review of Systems  Constitutional: Negative for fatigue and unexpected weight change.  HENT: Negative for trouble swallowing.   Eyes: Negative for visual disturbance.  Respiratory: Negative for cough, chest tightness and shortness of breath.   Cardiovascular: Negative for chest pain, palpitations and leg swelling.  Genitourinary: Negative for dysuria.  Neurological: Negative for dizziness, syncope, weakness, light-headedness and headaches.       Objective:   Physical Exam  Constitutional: He appears well-developed and well-nourished. No distress.  Neck: Neck supple. No thyromegaly present.       No carotid bruits  Cardiovascular: Normal rate and regular rhythm.   Murmur heard.      2/6 systolic ejection murmur right upper sternal border  Pulmonary/Chest: Effort normal and breath sounds normal. No respiratory distress. He has no wheezes. He has  no rales.  Musculoskeletal: He exhibits no edema.  Lymphadenopathy:    He has no cervical adenopathy.          Assessment & Plan:  #1 history of dyslipidemia. Check lipid panel. Refill Lipitor for one year  #2 history of CAD with borderline elevated blood pressure. Refill metoprolol. He is encouraged to establish with new primary provider with his upcoming move  #3 health maintenance. Flu vaccine offered and declined. Smoking cessation discussed. Low motivation to quit.

## 2012-02-11 NOTE — Progress Notes (Signed)
Quick Note:  Left a message for pt to return call. ______ 

## 2012-02-12 NOTE — Progress Notes (Signed)
Quick Note:  Pt informed on VM, [employer], form filled out, faxed and will scan to chart ______

## 2013-02-27 ENCOUNTER — Telehealth: Payer: Self-pay | Admitting: Family Medicine

## 2013-02-27 DIAGNOSIS — E785 Hyperlipidemia, unspecified: Secondary | ICD-10-CM

## 2013-02-27 DIAGNOSIS — I10 Essential (primary) hypertension: Secondary | ICD-10-CM

## 2013-02-27 DIAGNOSIS — I2510 Atherosclerotic heart disease of native coronary artery without angina pectoris: Secondary | ICD-10-CM

## 2013-02-27 MED ORDER — METOPROLOL SUCCINATE ER 50 MG PO TB24: 50.0000 mg | ORAL_TABLET | Freq: Every day | ORAL | Status: DC

## 2013-02-27 MED ORDER — ATORVASTATIN CALCIUM 20 MG PO TABS: 20.0000 mg | ORAL_TABLET | Freq: Every day | ORAL | Status: DC

## 2013-02-27 NOTE — Telephone Encounter (Signed)
Address is [address] [place]

## 2013-02-27 NOTE — Telephone Encounter (Signed)
Pt needs new rxs atorvastation 20 mg #90 and metoprolol 50 mg #90 with refills [facility] to [facility] [place] [phone]

## 2013-02-27 NOTE — Telephone Encounter (Signed)
Rx sent to pharmacy only #30 no refill back needs to be seen before getting refills

## 2013-03-27 ENCOUNTER — Other Ambulatory Visit: Payer: BC Managed Care – PPO

## 2013-04-03 ENCOUNTER — Encounter: Payer: Self-pay | Admitting: Family Medicine

## 2013-04-03 ENCOUNTER — Other Ambulatory Visit: Payer: Self-pay

## 2013-04-03 ENCOUNTER — Ambulatory Visit (INDEPENDENT_AMBULATORY_CARE_PROVIDER_SITE_OTHER): Payer: BC Managed Care – PPO | Admitting: Family Medicine

## 2013-04-03 VITALS — BP 140/76 | HR 73 | Temp 98.0°F | Wt 188.0 lb

## 2013-04-03 DIAGNOSIS — E785 Hyperlipidemia, unspecified: Secondary | ICD-10-CM

## 2013-04-03 DIAGNOSIS — I10 Essential (primary) hypertension: Secondary | ICD-10-CM

## 2013-04-03 DIAGNOSIS — I351 Nonrheumatic aortic (valve) insufficiency: Secondary | ICD-10-CM | POA: Insufficient documentation

## 2013-04-03 DIAGNOSIS — I359 Nonrheumatic aortic valve disorder, unspecified: Secondary | ICD-10-CM

## 2013-04-03 DIAGNOSIS — Z0000 Encounter for general adult medical examination without abnormal findings: Secondary | ICD-10-CM

## 2013-04-03 DIAGNOSIS — I2510 Atherosclerotic heart disease of native coronary artery without angina pectoris: Secondary | ICD-10-CM

## 2013-04-03 LAB — CBC WITH DIFFERENTIAL/PLATELET
Basophils Absolute: 0.1 10*3/uL (ref 0.0–0.1)
Eosinophils Relative: 2.5 % (ref 0.0–5.0)
Hemoglobin: 15.9 g/dL (ref 13.0–17.0)
Lymphocytes Relative: 24.1 % (ref 12.0–46.0)
Monocytes Relative: 5.8 % (ref 3.0–12.0)
Neutro Abs: 7.4 10*3/uL (ref 1.4–7.7)
Platelets: 183 10*3/uL (ref 150.0–400.0)
RDW: 12.8 % (ref 11.5–14.6)
WBC: 11.1 10*3/uL — ABNORMAL HIGH (ref 4.5–10.5)

## 2013-04-03 LAB — LIPID PANEL
LDL Cholesterol: 95 mg/dL (ref 0–99)
Total CHOL/HDL Ratio: 5
Triglycerides: 133 mg/dL (ref 0.0–149.0)

## 2013-04-03 LAB — HEPATIC FUNCTION PANEL
ALT: 18 U/L (ref 0–53)
Alkaline Phosphatase: 55 U/L (ref 39–117)
Bilirubin, Direct: 0.2 mg/dL (ref 0.0–0.3)
Total Bilirubin: 1.2 mg/dL (ref 0.3–1.2)
Total Protein: 6.9 g/dL (ref 6.0–8.3)

## 2013-04-03 LAB — POCT URINALYSIS DIPSTICK
Bilirubin, UA: NEGATIVE
Ketones, UA: NEGATIVE
Leukocytes, UA: NEGATIVE
Protein, UA: NEGATIVE
Spec Grav, UA: 1.015

## 2013-04-03 LAB — BASIC METABOLIC PANEL
BUN: 24 mg/dL — ABNORMAL HIGH (ref 6–23)
CO2: 27 mEq/L (ref 19–32)
Calcium: 9.1 mg/dL (ref 8.4–10.5)
Chloride: 102 mEq/L (ref 96–112)
Creatinine, Ser: 1 mg/dL (ref 0.4–1.5)
Glucose, Bld: 79 mg/dL (ref 70–99)

## 2013-04-03 LAB — TSH: TSH: 1.37 u[IU]/mL (ref 0.35–5.50)

## 2013-04-03 MED ORDER — ATORVASTATIN CALCIUM 20 MG PO TABS: 20.0000 mg | ORAL_TABLET | Freq: Every day | ORAL | Status: DC

## 2013-04-03 MED ORDER — METOPROLOL SUCCINATE ER 50 MG PO TB24: 50.0000 mg | ORAL_TABLET | Freq: Every day | ORAL | Status: DC

## 2013-04-03 NOTE — Patient Instructions (Addendum)
Let us know if you change your mind concerning shingles vaccine. Get back on baby aspirin one daily

## 2013-04-03 NOTE — Progress Notes (Signed)
Pre visit review using our clinic review tool, if applicable. No additional management support is needed unless otherwise documented below in the visit note. 

## 2013-04-03 NOTE — Progress Notes (Signed)
Subjective:    Patient ID: Eugene Daniel, male    DOB: 1950-10-27, 62 y.o.   MRN: [id number]  HPI Patient seen for complete physical. Past history of hyperlipidemia, CAD, aortic insufficiency. 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 Still lives here. Patient had cardiac catheterization way back in 2005 which revealed left anterior descending lesion which was treated medically. He also had noted aortic insufficiency at that time. He had no recent cardiac procedures or any cardiac follow up.  Denies any chest pains. He walks regularly with work including frequent stairs with no chest pains or dizziness. No dyspnea even with climbing stairs.  He declines both influenza and Pneumovax. He continues to smoke and has low motivation to quit. No history of shingles vaccine and he is undecided.  Past Medical History  Diagnosis Date  . HYPERLIPIDEMIA 01/01/2009  . CORONARY ARTERY DISEASE 01/01/2009  . ELEVATED PROSTATE SPECIFIC ANTIGEN 01/01/2009  . Kidney stone    Past Surgical History  Procedure Laterality Date  . Tonsillectomy      reports that he has been smoking.  He does not have any smokeless tobacco history on file. His alcohol and drug histories are not on file. family history includes Heart disease in his father and paternal grandfather; Leukemia in his paternal grandfather. No Known Allergies    Review of Systems  Constitutional: Negative for fever, activity change, appetite change and fatigue.  HENT: Negative for congestion, ear pain and trouble swallowing.   Eyes: Negative for pain and visual disturbance.  Respiratory: Negative for cough, shortness of breath and wheezing.   Cardiovascular: Negative for chest pain and palpitations.  Gastrointestinal: Negative for nausea, vomiting, abdominal pain, diarrhea, constipation, blood in stool, abdominal distention and rectal pain.  Genitourinary: Negative for dysuria, hematuria and testicular  pain.  Musculoskeletal: Negative for arthralgias and joint swelling.  Skin: Negative for rash.  Neurological: Negative for dizziness, syncope and headaches.  Hematological: Negative for adenopathy.  Psychiatric/Behavioral: Negative for confusion and dysphoric mood.       Objective:   Physical Exam  Constitutional: He is oriented to person, place, and time. He appears well-developed and well-nourished. No distress.  HENT:  Head: Normocephalic and atraumatic.  Right Ear: External ear normal.  Left Ear: External ear normal.  Mouth/Throat: Oropharynx is clear and moist.  Eyes: Conjunctivae and EOM are normal. Pupils are equal, round, and reactive to light.  Neck: Normal range of motion. Neck supple. No thyromegaly present.  Cardiovascular: Normal rate and regular rhythm.  Exam reveals no gallop and no friction rub.   Murmur heard. 2/6 systolic murmur over aortic area.  Pulmonary/Chest: No respiratory distress. He has no wheezes. He has no rales.  Abdominal: Soft. Bowel sounds are normal. He exhibits no distension and no mass. There is no tenderness. There is no rebound and no guarding.  Genitourinary: Rectum normal and prostate normal.  Musculoskeletal: He exhibits no edema.  Lymphadenopathy:    He has no cervical adenopathy.  Neurological: He is alert and oriented to person, place, and time. He displays normal reflexes. No cranial nerve deficit.  Skin: No rash noted.  Psychiatric: He has a normal mood and affect.          Assessment & Plan:  Complete physical. Screening labs including PSA ordered. Pneumovax and flu vaccine recommended and he declines both. Check on coverage for shingles vaccine. We've recommended that he reestablish with cardiology regarding his history of CAD and aortic insufficiency,  though he has been symptomatically stable for quite some time. He is encouraged to start back baby aspirin which is not currently taking. Refill metoprolol and Lipitor for one year.  Colonoscopy up to date

## 2013-06-29 ENCOUNTER — Ambulatory Visit (INDEPENDENT_AMBULATORY_CARE_PROVIDER_SITE_OTHER): Payer: BC Managed Care – PPO | Admitting: Cardiology

## 2013-06-29 ENCOUNTER — Encounter: Payer: Self-pay | Admitting: Cardiology

## 2013-06-29 ENCOUNTER — Encounter (INDEPENDENT_AMBULATORY_CARE_PROVIDER_SITE_OTHER): Payer: Self-pay

## 2013-06-29 VITALS — BP 140/70 | HR 66 | Ht 74.0 in | Wt 186.0 lb

## 2013-06-29 DIAGNOSIS — I359 Nonrheumatic aortic valve disorder, unspecified: Secondary | ICD-10-CM

## 2013-06-29 DIAGNOSIS — Z79899 Other long term (current) drug therapy: Secondary | ICD-10-CM

## 2013-06-29 DIAGNOSIS — I2510 Atherosclerotic heart disease of native coronary artery without angina pectoris: Secondary | ICD-10-CM

## 2013-06-29 DIAGNOSIS — E785 Hyperlipidemia, unspecified: Secondary | ICD-10-CM

## 2013-06-29 DIAGNOSIS — I351 Nonrheumatic aortic (valve) insufficiency: Secondary | ICD-10-CM

## 2013-06-29 MED ORDER — ATORVASTATIN CALCIUM 40 MG PO TABS: 40.0000 mg | ORAL_TABLET | Freq: Every day | ORAL | Status: DC

## 2013-06-29 NOTE — Patient Instructions (Signed)
Please increase your Atorvastatin to 40 mg a day. Continue all other medications as listed.  Please have fasting blood work in 8 weeks. (Lipid and Liver panel)  Your physician has requested that you have an echocardiogram. Echocardiography is a painless test that uses sound waves to create images of your heart. It provides your doctor with information about the size and shape of your heart and how well your heart's chambers and valves are working. This procedure takes approximately one hour. There are no restrictions for this procedure.  Your physician has requested that you have an exercise tolerance test in 4 months with Dr Antoine PocheHochrein. For further information please visit [email or web address]. Please also follow instruction sheet, as given.

## 2013-06-29 NOTE — Progress Notes (Signed)
HPI The patient presents as a new patient for evaluation of known coronary artery disease. He reports a history of a distant apparently out of hospital myocardial infarction perhaps in 1992.  I have no records of this.  I do see a cath from 2005. The results are described below. Aside from his initial event many years ago he said he's never had any chest discomfort. He is active walking his dogs although he doesn't exercise routinely. With his activities he denies any chest pressure, neck or arm discomfort. He has no shortness of breath, PND or orthopnea. He has no palpitations, presyncope or syncope. He has no weight gain or edema.  No Known Allergies  Current Outpatient Prescriptions  Medication Sig Dispense Refill  . aspirin 81 MG tablet Take 81 mg by mouth daily.      Marland Kitchen. atorvastatin (LIPITOR) 20 MG tablet Take 1 tablet (20 mg total) by mouth daily.  90 tablet  3  . metoprolol succinate (TOPROL-XL) 50 MG 24 hr tablet Take 1 tablet (50 mg total) by mouth daily.  90 tablet  3   No current facility-administered medications for this visit.    Past Medical History  Diagnosis Date  . HYPERLIPIDEMIA 01/01/2009  . CORONARY ARTERY DISEASE 01/01/2009  . ELEVATED PROSTATE SPECIFIC ANTIGEN 01/01/2009  . Kidney stone     Past Surgical History  Procedure Laterality Date  . Tonsillectomy      Family History  Problem Relation Age of Onset  . Heart disease Father   . Heart disease Paternal Grandfather   . Leukemia Paternal Grandfather     History   Social History  . Marital Status: Married    Spouse Name: N/A    Number of Children: N/A  . Years of Education: N/A   Occupational History  . Not on file.   Social History Main Topics  . Smoking status: Current Every Day Smoker  . Smokeless tobacco: Not on file  . Alcohol Use: Not on file  . Drug Use: Not on file  . Sexual Activity: Not on file   Other Topics Concern  . Not on file   Social History Narrative  . No narrative on  file    ROS:  As stated in the HPI and negative for all other systems.  PHYSICAL EXAM BP 140/70  Pulse 66  Ht 6\' 2"  (1.88 m)  Wt 186 lb (84.369 kg)  BMI 23.87 kg/m2 GENERAL:  Well appearing HEENT:  Pupils equal round and reactive, fundi not visualized, oral mucosa unremarkable NECK:  No jugular venous distention, waveform within normal limits, carotid upstroke brisk and symmetric, no bruits, no thyromegaly LYMPHATICS:  No cervical, inguinal adenopathy LUNGS:  Clear to auscultation bilaterally BACK:  No CVA tenderness CHEST:  Unremarkable HEART:  PMI not displaced or sustained,S1 and S2 within normal limits, no S3, no S4, no clicks, no rubs, 2/6 diastolic early diastolic murmur heard at the left 3rd intercostal space.  ABD:  Flat, positive bowel sounds normal in frequency in pitch, no bruits, no rebound, no guarding, no midline pulsatile mass, no hepatomegaly, no splenomegaly EXT:  2 plus pulses throughout, no edema, no cyanosis no clubbing SKIN:  No rashes no nodules NEURO:  Cranial nerves II through XII grossly intact, motor grossly intact throughout PSYCH:  Cognitively intact, oriented to person place and time   EKG:  Sinus rhythm, rate 66, axis within normal limits, intervals within normal limits, old anteroseptal infarct, no acute ST-T wave changes.  06/29/2013  ASSESSMENT AND PLAN  CAD:  The patient will continue with primary risk reduction for now. I would however like to bring him back for a treadmill test at some point in the future and I will schedule this.  DYSLIPIDEMIA:  I reviewed his last lipid profile. The LDL was 95. I have suggested that he increase his Lipitor to 40 mg daily.  AI:  The patient has aortic insufficiency which was mild previously and evidence on physical exam. He's also had a mildly reduced ejection fraction. I will check an echocardiogram.  TOBACCO USE:  He understands the need to stop smoking completely.

## 2013-07-19 ENCOUNTER — Other Ambulatory Visit (HOSPITAL_COMMUNITY): Payer: BC Managed Care – PPO

## 2013-07-19 ENCOUNTER — Telehealth: Payer: Self-pay | Admitting: Cardiology

## 2013-07-19 ENCOUNTER — Ambulatory Visit (HOSPITAL_COMMUNITY): Payer: BC Managed Care – PPO | Attending: Cardiology | Admitting: Radiology

## 2013-07-19 DIAGNOSIS — I359 Nonrheumatic aortic valve disorder, unspecified: Secondary | ICD-10-CM | POA: Insufficient documentation

## 2013-07-19 DIAGNOSIS — I059 Rheumatic mitral valve disease, unspecified: Secondary | ICD-10-CM | POA: Insufficient documentation

## 2013-07-19 DIAGNOSIS — I2510 Atherosclerotic heart disease of native coronary artery without angina pectoris: Secondary | ICD-10-CM | POA: Insufficient documentation

## 2013-07-19 DIAGNOSIS — F17200 Nicotine dependence, unspecified, uncomplicated: Secondary | ICD-10-CM | POA: Insufficient documentation

## 2013-07-19 DIAGNOSIS — I351 Nonrheumatic aortic (valve) insufficiency: Secondary | ICD-10-CM

## 2013-07-19 DIAGNOSIS — E785 Hyperlipidemia, unspecified: Secondary | ICD-10-CM | POA: Insufficient documentation

## 2013-07-19 NOTE — Progress Notes (Signed)
Echocardiogram performed.  

## 2013-07-19 NOTE — Telephone Encounter (Signed)
Walk in pt Form " [facility]ening [facility]

## 2013-07-24 ENCOUNTER — Telehealth: Payer: Self-pay | Admitting: Cardiology

## 2013-07-24 NOTE — Telephone Encounter (Signed)
Haven't seen a form - will check with tech that did his echo.

## 2013-07-24 NOTE — Telephone Encounter (Signed)
New message     Pt brought wellness form with him when he had his echo.  Please fax form to number on the form and mail confirmation and form to patient

## 2013-08-01 NOTE — Telephone Encounter (Signed)
Pt aware the form has been completed, faxed and is scanned into EPIC under media

## 2013-08-02 ENCOUNTER — Telehealth: Payer: Self-pay | Admitting: Cardiology

## 2013-08-02 NOTE — Telephone Encounter (Signed)
3.4.15 , 3.6.15, 3.9.15, Called pt No Answer Left Message No call Back [employer] paper He Dropped Off  Completed & Ready For Pick up, Mailed to Pt Home Address [phone]

## 2013-09-26 ENCOUNTER — Encounter: Payer: BC Managed Care – PPO | Admitting: Physician Assistant

## 2013-10-27 ENCOUNTER — Ambulatory Visit (INDEPENDENT_AMBULATORY_CARE_PROVIDER_SITE_OTHER): Payer: BC Managed Care – PPO | Admitting: Physician Assistant

## 2013-10-27 DIAGNOSIS — I2510 Atherosclerotic heart disease of native coronary artery without angina pectoris: Secondary | ICD-10-CM

## 2013-10-27 NOTE — Progress Notes (Signed)
Exercise Treadmill Test  Pre-Exercise Testing Evaluation Rhythm: normal sinus  Rate: 60 bpm     Test  Exercise Tolerance Test Ordering MD: Angelina Sheriff, MD  Interpreting MD: Tereso Newcomer, PA-C  Unique Test No: 1  Treadmill:  1  Indication for ETT: known ASHD  Contraindication to ETT: No   Stress Modality: exercise - treadmill  Cardiac Imaging Performed: non   Protocol: standard Bruce - maximal  Max BP:  172/75  Max MPHR (bpm):  160 85% MPR (bpm):  136  MPHR obtained (bpm):  136 % MPHR obtained:  85  Reached 85% MPHR (min:sec):  10:59 Total Exercise Time (min-sec):  11:00  Workload in METS:  13.4 Borg Scale: 15  Reason ETT Terminated:  desired heart rate attained    ST Segment Analysis At Rest: normal ST segments - no evidence of significant ST depression With Exercise: non-specific ST changes  Other Information Arrhythmia:  No Angina during ETT:  absent (0) Quality of ETT:  diagnostic  ETT Interpretation:  normal - no evidence of ischemia by ST analysis  Comments: Good exercise capacity. No chest pain. Normal BP response to exercise. Non-specific ST changes. Increased artifact.  No obvious ischemic changes.  Good HR recovery in 1st minute post exercise.  Recommendations: F/u with Dr. Rollene Rotunda as directed. Signed,  Tereso Newcomer, PA-C   10/27/2013 9:57 AM

## 2014-04-06 ENCOUNTER — Encounter: Payer: Self-pay | Admitting: Family Medicine

## 2014-04-06 ENCOUNTER — Ambulatory Visit (INDEPENDENT_AMBULATORY_CARE_PROVIDER_SITE_OTHER): Payer: BC Managed Care – PPO | Admitting: Family Medicine

## 2014-04-06 VITALS — BP 130/80 | HR 60 | Temp 97.5°F | Ht 72.5 in | Wt 188.0 lb

## 2014-04-06 DIAGNOSIS — Z23 Encounter for immunization: Secondary | ICD-10-CM

## 2014-04-06 DIAGNOSIS — I2510 Atherosclerotic heart disease of native coronary artery without angina pectoris: Secondary | ICD-10-CM

## 2014-04-06 DIAGNOSIS — Z0000 Encounter for general adult medical examination without abnormal findings: Secondary | ICD-10-CM

## 2014-04-06 DIAGNOSIS — E785 Hyperlipidemia, unspecified: Secondary | ICD-10-CM

## 2014-04-06 DIAGNOSIS — I10 Essential (primary) hypertension: Secondary | ICD-10-CM

## 2014-04-06 MED ORDER — METOPROLOL SUCCINATE ER 50 MG PO TB24: 50.0000 mg | ORAL_TABLET | Freq: Every day | ORAL | Status: DC

## 2014-04-06 MED ORDER — VARENICLINE TARTRATE 0.5 MG X 11 & 1 MG X 42 PO MISC: ORAL | Status: DC

## 2014-04-06 MED ORDER — ATORVASTATIN CALCIUM 40 MG PO TABS: 40.0000 mg | ORAL_TABLET | Freq: Every day | ORAL | Status: DC

## 2014-04-06 MED ORDER — VARENICLINE TARTRATE 1 MG PO TABS: 1.0000 mg | ORAL_TABLET | Freq: Two times a day (BID) | ORAL | Status: DC

## 2014-04-06 NOTE — Progress Notes (Signed)
Pre visit review using our clinic review tool, if applicable. No additional management support is needed unless otherwise documented below in the visit note. 

## 2014-04-06 NOTE — Progress Notes (Signed)
   Subjective:    Patient ID: Eugene HeysJoseph P Daniel, male    DOB: 11-22-1950, 63 y.o.   MRN: [id number]  HPI Patient seen for complete physical.  He has history of hyperlipidemia and CAD. Followed by cardiology. He is unfortunately still smoking but desires to quit. He specifically is requesting trial of Chantix which he has not taken previously. No recent chest pains. He remains on aspirin, atorvastatin, and metoprolol. He declines shingles vaccine. Flu vaccine given today. Tetanus up-to-date. Colonoscopy up-to-date. Patient states he had recent screening labs at another site and these were supposed to have been faxed to us but we have not seen them. He states these labs included PSA and he was instructed they were all normal  Past Medical History  Diagnosis Date  . HYPERLIPIDEMIA 01/01/2009  . CORONARY ARTERY DISEASE     Cath 2005. LAD 75% stenosis in a small vessel. EF 45-50%.  Marland Kitchen. ELEVATED PROSTATE SPECIFIC ANTIGEN 01/01/2009  . Kidney stone    Past Surgical History  Procedure Laterality Date  . Tonsillectomy      reports that he has been smoking Cigarettes.  He has been smoking about 0.00 packs per day. He does not have any smokeless tobacco history on file. His alcohol and drug histories are not on file. family history includes Heart disease in his paternal grandfather; Leukemia in his paternal grandfather. No Known Allergies    Review of Systems  Constitutional: Negative for fever, activity change, appetite change, fatigue and unexpected weight change.  HENT: Negative for congestion, ear pain and trouble swallowing.   Eyes: Negative for pain and visual disturbance.  Respiratory: Negative for cough, shortness of breath and wheezing.   Cardiovascular: Negative for chest pain and palpitations.  Gastrointestinal: Negative for nausea, vomiting, abdominal pain, diarrhea, constipation, blood in stool, abdominal distention and rectal pain.  Genitourinary: Negative for dysuria, hematuria and  testicular pain.  Musculoskeletal: Negative for joint swelling and arthralgias.  Skin: Negative for rash.  Neurological: Negative for dizziness, syncope and headaches.  Hematological: Negative for adenopathy.  Psychiatric/Behavioral: Negative for confusion and dysphoric mood.       Objective:   Physical Exam  Constitutional: He is oriented to person, place, and time. He appears well-developed and well-nourished. No distress.  HENT:  Head: Normocephalic and atraumatic.  Right Ear: External ear normal.  Left Ear: External ear normal.  Mouth/Throat: Oropharynx is clear and moist.  Eyes: Conjunctivae and EOM are normal. Pupils are equal, round, and reactive to light.  Neck: Normal range of motion. Neck supple. No thyromegaly present.  Cardiovascular: Normal rate, regular rhythm and normal heart sounds.   No murmur heard. Pulmonary/Chest: No respiratory distress. He has no wheezes. He has no rales.  Abdominal: Soft. Bowel sounds are normal. He exhibits no distension and no mass. There is no tenderness. There is no rebound and no guarding.  Musculoskeletal: He exhibits no edema.  Lymphadenopathy:    He has no cervical adenopathy.  Neurological: He is alert and oriented to person, place, and time. He displays normal reflexes. No cranial nerve deficit.  Skin: No rash noted.  Psychiatric: He has a normal mood and affect.          Assessment & Plan:  Complete physical. We discussed smoking cessation. Prescription given for Chantix and possible side effects reviewed. Refill medications for one year. He will consider shingles vaccine. We have asked that he have his recent labs forwarded to us

## 2014-04-11 ENCOUNTER — Telehealth: Payer: Self-pay | Admitting: Family Medicine

## 2014-04-11 NOTE — Telephone Encounter (Signed)
emmi emailed °

## 2014-04-23 ENCOUNTER — Telehealth: Payer: Self-pay | Admitting: Family Medicine

## 2014-04-23 NOTE — Telephone Encounter (Signed)
Pt needs order for the labs he [facility] not get [facility]. Only got hdl and cholestrol. Can you put the order in?

## 2014-04-24 ENCOUNTER — Other Ambulatory Visit: Payer: Self-pay | Admitting: Family Medicine

## 2014-04-24 DIAGNOSIS — I10 Essential (primary) hypertension: Secondary | ICD-10-CM

## 2014-04-24 NOTE — Telephone Encounter (Signed)
CBC,CMP,TSH, PSA OK to add.

## 2014-04-24 NOTE — Telephone Encounter (Signed)
Labs are ordered 

## 2014-05-07 ENCOUNTER — Other Ambulatory Visit (INDEPENDENT_AMBULATORY_CARE_PROVIDER_SITE_OTHER): Payer: BC Managed Care – PPO

## 2014-05-07 DIAGNOSIS — Z79899 Other long term (current) drug therapy: Secondary | ICD-10-CM

## 2014-05-07 DIAGNOSIS — E785 Hyperlipidemia, unspecified: Secondary | ICD-10-CM

## 2014-05-07 DIAGNOSIS — I10 Essential (primary) hypertension: Secondary | ICD-10-CM

## 2014-05-07 LAB — COMPREHENSIVE METABOLIC PANEL
ALK PHOS: 69 U/L (ref 39–117)
ALT: 31 U/L (ref 0–53)
AST: 26 U/L (ref 0–37)
Albumin: 3.9 g/dL (ref 3.5–5.2)
BILIRUBIN TOTAL: 1.3 mg/dL — AB (ref 0.2–1.2)
BUN: 22 mg/dL (ref 6–23)
CO2: 29 mEq/L (ref 19–32)
CREATININE: 1.1 mg/dL (ref 0.4–1.5)
Calcium: 9 mg/dL (ref 8.4–10.5)
Chloride: 105 mEq/L (ref 96–112)
GFR: 73 mL/min (ref >60.00)
Glucose, Bld: 96 mg/dL (ref 70–99)
Potassium: 4.2 mEq/L (ref 3.5–5.1)
SODIUM: 138 meq/L (ref 135–145)
TOTAL PROTEIN: 6.6 g/dL (ref 6.0–8.3)

## 2014-05-07 LAB — HEPATIC FUNCTION PANEL
ALK PHOS: 69 U/L (ref 39–117)
ALT: 31 U/L (ref 0–53)
AST: 26 U/L (ref 0–37)
Albumin: 3.9 g/dL (ref 3.5–5.2)
BILIRUBIN DIRECT: 0.2 mg/dL (ref 0.0–0.3)
BILIRUBIN TOTAL: 1.3 mg/dL — AB (ref 0.2–1.2)
TOTAL PROTEIN: 6.6 g/dL (ref 6.0–8.3)

## 2014-05-07 LAB — LIPID PANEL
CHOL/HDL RATIO: 3
Cholesterol: 111 mg/dL (ref 0–200)
HDL: 33.9 mg/dL — ABNORMAL LOW (ref >39.00)
LDL Cholesterol: 61 mg/dL (ref 0–99)
NONHDL: 77.1
Triglycerides: 81 mg/dL (ref 0.0–149.0)
VLDL: 16.2 mg/dL (ref 0.0–40.0)

## 2014-05-07 LAB — CBC WITH DIFFERENTIAL/PLATELET
BASOS ABS: 0.1 10*3/uL (ref 0.0–0.1)
Basophils Relative: 0.6 % (ref 0.0–3.0)
EOS ABS: 0.2 10*3/uL (ref 0.0–0.7)
Eosinophils Relative: 1.9 % (ref 0.0–5.0)
HEMATOCRIT: 44.2 % (ref 39.0–52.0)
Hemoglobin: 14.8 g/dL (ref 13.0–17.0)
LYMPHS ABS: 1.9 10*3/uL (ref 0.7–4.0)
Lymphocytes Relative: 21.8 % (ref 12.0–46.0)
MCHC: 33.4 g/dL (ref 30.0–36.0)
MCV: 90.7 fl (ref 78.0–100.0)
Monocytes Absolute: 0.5 10*3/uL (ref 0.1–1.0)
Monocytes Relative: 5.5 % (ref 3.0–12.0)
NEUTROS ABS: 6.1 10*3/uL (ref 1.4–7.7)
Neutrophils Relative %: 70.2 % (ref 43.0–77.0)
Platelets: 170 10*3/uL (ref 150.0–400.0)
RBC: 4.88 Mil/uL (ref 4.22–5.81)
RDW: 12.7 % (ref 11.5–15.5)
WBC: 8.6 10*3/uL (ref 4.0–10.5)

## 2014-05-07 LAB — PSA: PSA: 1.7 ng/mL (ref 0.10–4.00)

## 2014-05-07 LAB — TSH: TSH: 2.6 u[IU]/mL (ref 0.35–4.50)

## 2014-06-02 ENCOUNTER — Other Ambulatory Visit: Payer: Self-pay | Admitting: Family Medicine

## 2014-08-26 ENCOUNTER — Other Ambulatory Visit: Payer: Self-pay | Admitting: Family Medicine

## 2014-09-26 ENCOUNTER — Encounter: Payer: Self-pay | Admitting: Gastroenterology

## 2014-12-16 ENCOUNTER — Other Ambulatory Visit: Payer: Self-pay | Admitting: Family Medicine

## 2015-04-02 ENCOUNTER — Other Ambulatory Visit (INDEPENDENT_AMBULATORY_CARE_PROVIDER_SITE_OTHER): Payer: BLUE CROSS/BLUE SHIELD

## 2015-04-02 DIAGNOSIS — Z0000 Encounter for general adult medical examination without abnormal findings: Secondary | ICD-10-CM

## 2015-04-02 LAB — BASIC METABOLIC PANEL
BUN: 21 mg/dL (ref 6–23)
CHLORIDE: 105 meq/L (ref 96–112)
CO2: 31 meq/L (ref 19–32)
Calcium: 9.4 mg/dL (ref 8.4–10.5)
Creatinine, Ser: 1.09 mg/dL (ref 0.40–1.50)
GFR: 72.79 mL/min (ref >60.00)
Glucose, Bld: 96 mg/dL (ref 70–99)
Potassium: 5.5 mEq/L — ABNORMAL HIGH (ref 3.5–5.1)
Sodium: 142 mEq/L (ref 135–145)

## 2015-04-02 LAB — CBC WITH DIFFERENTIAL/PLATELET
BASOS PCT: 0.8 % (ref 0.0–3.0)
Basophils Absolute: 0.1 10*3/uL (ref 0.0–0.1)
EOS ABS: 0.3 10*3/uL (ref 0.0–0.7)
Eosinophils Relative: 3.6 % (ref 0.0–5.0)
HCT: 47.5 % (ref 39.0–52.0)
Hemoglobin: 15.8 g/dL (ref 13.0–17.0)
LYMPHS ABS: 2.5 10*3/uL (ref 0.7–4.0)
Lymphocytes Relative: 31.1 % (ref 12.0–46.0)
MCHC: 33.2 g/dL (ref 30.0–36.0)
MCV: 91 fl (ref 78.0–100.0)
Monocytes Absolute: 0.6 10*3/uL (ref 0.1–1.0)
Monocytes Relative: 7.6 % (ref 3.0–12.0)
NEUTROS ABS: 4.6 10*3/uL (ref 1.4–7.7)
NEUTROS PCT: 56.9 % (ref 43.0–77.0)
PLATELETS: 185 10*3/uL (ref 150.0–400.0)
RBC: 5.22 Mil/uL (ref 4.22–5.81)
RDW: 14.5 % (ref 11.5–15.5)
WBC: 8 10*3/uL (ref 4.0–10.5)

## 2015-04-02 LAB — HEPATIC FUNCTION PANEL
ALT: 38 U/L (ref 0–53)
AST: 32 U/L (ref 0–37)
Albumin: 3.7 g/dL (ref 3.5–5.2)
Alkaline Phosphatase: 87 U/L (ref 39–117)
BILIRUBIN TOTAL: 1.2 mg/dL (ref 0.2–1.2)
Bilirubin, Direct: 0.4 mg/dL — ABNORMAL HIGH (ref 0.0–0.3)
Total Protein: 6.7 g/dL (ref 6.0–8.3)

## 2015-04-02 LAB — LIPID PANEL
CHOL/HDL RATIO: 3
Cholesterol: 149 mg/dL (ref 0–200)
HDL: 44.2 mg/dL (ref >39.00)
LDL Cholesterol: 82 mg/dL (ref 0–99)
NONHDL: 104.57
Triglycerides: 113 mg/dL (ref 0.0–149.0)
VLDL: 22.6 mg/dL (ref 0.0–40.0)

## 2015-04-02 LAB — PSA: PSA: 1.4 ng/mL (ref 0.10–4.00)

## 2015-04-02 LAB — TSH: TSH: 1.89 u[IU]/mL (ref 0.35–4.50)

## 2015-04-09 ENCOUNTER — Ambulatory Visit (INDEPENDENT_AMBULATORY_CARE_PROVIDER_SITE_OTHER): Payer: BLUE CROSS/BLUE SHIELD | Admitting: Family Medicine

## 2015-04-09 ENCOUNTER — Encounter: Payer: Self-pay | Admitting: Family Medicine

## 2015-04-09 VITALS — BP 150/88 | HR 76 | Temp 98.7°F | Resp 20 | Ht 72.0 in | Wt 192.0 lb

## 2015-04-09 DIAGNOSIS — Z0000 Encounter for general adult medical examination without abnormal findings: Secondary | ICD-10-CM | POA: Diagnosis not present

## 2015-04-09 DIAGNOSIS — Z23 Encounter for immunization: Secondary | ICD-10-CM | POA: Diagnosis not present

## 2015-04-09 MED ORDER — VALSARTAN 80 MG PO TABS: 80.0000 mg | ORAL_TABLET | Freq: Every day | ORAL | Status: DC

## 2015-04-09 NOTE — Progress Notes (Signed)
Pre visit review using our clinic review tool, if applicable. No additional management support is needed unless otherwise documented below in the visit note. 

## 2015-04-09 NOTE — Progress Notes (Signed)
   Subjective:    Patient ID: Eugene HeysJoseph P Brener, male    DOB: [birth year], 64 y.o.   MRN: [id number]  HPI Patient here for complete physical. He quit smoking a year ago after taking Chantix and has been smoke free for about one year. Chronic problems include history of CAD and hyperlipidemia. He remains on aspirin, metoprolol, and Lipitor. Denies any recent chest pains. He retired recently and has been engaged in lots of physical activities. No history of shingles vaccine. Needs flu vaccine. Colonoscopy up-to-date.  Past Medical History  Diagnosis Date  . HYPERLIPIDEMIA 01/01/2009  . CORONARY ARTERY DISEASE     Cath 2005. LAD 75% stenosis in a small vessel. EF 45-50%.  Marland Kitchen. ELEVATED PROSTATE SPECIFIC ANTIGEN 01/01/2009  . Kidney stone    Past Surgical History  Procedure Laterality Date  . Tonsillectomy      reports that he quit smoking about a year ago. His smoking use included Cigarettes. He has never used smokeless tobacco. He reports that he drinks alcohol. He reports that he does not use illicit drugs. family history includes Heart disease in his paternal grandfather; Leukemia in his paternal grandfather. No Known Allergies    Review of Systems  Constitutional: Negative for fever, activity change, appetite change and fatigue.  HENT: Negative for congestion, ear pain and trouble swallowing.   Eyes: Negative for pain and visual disturbance.  Respiratory: Negative for cough, shortness of breath and wheezing.   Cardiovascular: Negative for chest pain and palpitations.  Gastrointestinal: Negative for nausea, vomiting, abdominal pain, diarrhea, constipation, blood in stool, abdominal distention and rectal pain.  Endocrine: Negative for polydipsia and polyuria.  Genitourinary: Negative for dysuria, hematuria and testicular pain.  Musculoskeletal: Negative for joint swelling and arthralgias.  Skin: Negative for rash.  Neurological: Negative for dizziness, syncope and headaches.    Hematological: Negative for adenopathy.  Psychiatric/Behavioral: Negative for confusion and dysphoric mood.       Objective:   Physical Exam  Constitutional: He is oriented to person, place, and time. He appears well-developed and well-nourished. No distress.  HENT:  Head: Normocephalic and atraumatic.  Right Ear: External ear normal.  Left Ear: External ear normal.  Mouth/Throat: Oropharynx is clear and moist.  Eyes: Conjunctivae and EOM are normal. Pupils are equal, round, and reactive to light.  Neck: Normal range of motion. Neck supple. No thyromegaly present.  Cardiovascular: Normal rate, regular rhythm and normal heart sounds.   No murmur heard. Pulmonary/Chest: No respiratory distress. He has no wheezes. He has no rales.  Abdominal: Soft. Bowel sounds are normal. He exhibits no distension and no mass. There is no tenderness. There is no rebound and no guarding.  Musculoskeletal: He exhibits no edema.  Lymphadenopathy:    He has no cervical adenopathy.  Neurological: He is alert and oriented to person, place, and time. He displays normal reflexes. No cranial nerve deficit.  Skin: No rash noted.  Psychiatric: He has a normal mood and affect.          Assessment & Plan:  Complete physical. Check on coverage for shingles vaccine. Flu vaccine given. Congratulated on quitting smoking. Blood pressure elevated today. Repeat reading after rest 160/80. Start back Diovan 80 mg once daily. Reassess blood pressure in 4 weeks

## 2015-04-09 NOTE — Patient Instructions (Signed)
Check on insurance coverage for shingles vaccine if interested.   

## 2015-04-19 ENCOUNTER — Other Ambulatory Visit: Payer: Self-pay | Admitting: Family Medicine

## 2015-05-09 ENCOUNTER — Ambulatory Visit: Payer: BLUE CROSS/BLUE SHIELD | Admitting: Family Medicine

## 2015-05-15 ENCOUNTER — Ambulatory Visit (INDEPENDENT_AMBULATORY_CARE_PROVIDER_SITE_OTHER): Payer: BLUE CROSS/BLUE SHIELD | Admitting: Family Medicine

## 2015-05-15 ENCOUNTER — Encounter: Payer: Self-pay | Admitting: Family Medicine

## 2015-05-15 VITALS — BP 150/80 | HR 81 | Temp 98.4°F | Resp 20 | Ht 72.0 in | Wt 194.0 lb

## 2015-05-15 DIAGNOSIS — I10 Essential (primary) hypertension: Secondary | ICD-10-CM | POA: Diagnosis not present

## 2015-05-15 MED ORDER — VALSARTAN 160 MG PO TABS: 160.0000 mg | ORAL_TABLET | Freq: Every day | ORAL | Status: DC

## 2015-05-15 NOTE — Patient Instructions (Signed)
Go ahead and increase Diovan to 160 mg once daily Be in touch if BP not consistently < 140/90.

## 2015-05-15 NOTE — Progress Notes (Signed)
Pre visit review using our clinic review tool, if applicable. No additional management support is needed unless otherwise documented below in the visit note. 

## 2015-05-15 NOTE — Progress Notes (Signed)
   Subjective:    Patient ID: Eugene Daniel, male    DOB: 09-29-1950, 64 y.o.   MRN: [id number]  HPI Patient here for follow-up hypertension We started back Diovan 80 mg once daily. He denies any headaches or chest pains. No dyspnea. No peripheral edema. No side effects. Compliant with therapy. No consistent aerobic exercise. Minimal alcohol use. Denies any nonsteroidal use or other medications that would be exacerbating  Past Medical History  Diagnosis Date  . HYPERLIPIDEMIA 01/01/2009  . CORONARY ARTERY DISEASE     Cath 2005. LAD 75% stenosis in a small vessel. EF 45-50%.  Marland Kitchen. ELEVATED PROSTATE SPECIFIC ANTIGEN 01/01/2009  . Kidney stone    Past Surgical History  Procedure Laterality Date  . Tonsillectomy      reports that he quit smoking about 13 months ago. His smoking use included Cigarettes. He has never used smokeless tobacco. He reports that he drinks alcohol. He reports that he does not use illicit drugs. family history includes Heart disease in his paternal grandfather; Leukemia in his paternal grandfather. No Known Allergies     Review of Systems  Constitutional: Negative for fatigue.  Eyes: Negative for visual disturbance.  Respiratory: Negative for cough, chest tightness and shortness of breath.   Cardiovascular: Negative for chest pain, palpitations and leg swelling.  Endocrine: Negative for polydipsia and polyuria.  Neurological: Negative for dizziness, syncope, weakness, light-headedness and headaches.       Objective:   Physical Exam  Constitutional: He appears well-developed and well-nourished.  Neck: Neck supple. No thyromegaly present.  Cardiovascular: Normal rate and regular rhythm.   Pulmonary/Chest: Effort normal and breath sounds normal. No respiratory distress. He has no wheezes. He has no rales.  Musculoskeletal: He exhibits no edema.  Lymphadenopathy:    He has no cervical adenopathy.          Assessment & Plan:  Hypertension.  Suboptimal control. Increase Diovan to 160 mg once daily. Monitor closely and be in touch if not consistently below 140/90 over the next few weeks

## 2016-05-04 ENCOUNTER — Other Ambulatory Visit: Payer: Self-pay | Admitting: Family Medicine

## 2016-05-05 ENCOUNTER — Other Ambulatory Visit: Payer: Self-pay | Admitting: Family Medicine

## 2016-05-05 ENCOUNTER — Telehealth: Payer: Self-pay | Admitting: Emergency Medicine

## 2016-05-12 ENCOUNTER — Ambulatory Visit: Payer: BLUE CROSS/BLUE SHIELD | Admitting: Family Medicine

## 2016-05-28 NOTE — Telephone Encounter (Signed)
Message complete

## 2016-07-06 ENCOUNTER — Other Ambulatory Visit: Payer: Self-pay | Admitting: Family Medicine

## 2016-12-04 ENCOUNTER — Other Ambulatory Visit: Payer: Self-pay | Admitting: Family Medicine

## 2016-12-07 ENCOUNTER — Ambulatory Visit (INDEPENDENT_AMBULATORY_CARE_PROVIDER_SITE_OTHER): Payer: BLUE CROSS/BLUE SHIELD | Admitting: Family Medicine

## 2016-12-07 ENCOUNTER — Encounter: Payer: Self-pay | Admitting: Family Medicine

## 2016-12-07 VITALS — BP 142/78 | HR 65 | Temp 98.7°F | Ht 72.0 in | Wt 200.0 lb

## 2016-12-07 DIAGNOSIS — I2510 Atherosclerotic heart disease of native coronary artery without angina pectoris: Secondary | ICD-10-CM

## 2016-12-07 DIAGNOSIS — I10 Essential (primary) hypertension: Secondary | ICD-10-CM

## 2016-12-07 DIAGNOSIS — E785 Hyperlipidemia, unspecified: Secondary | ICD-10-CM

## 2016-12-07 LAB — LIPID PANEL
CHOLESTEROL: 126 mg/dL (ref 0–200)
HDL: 36.9 mg/dL — AB (ref >39.00)
LDL CALC: 66 mg/dL (ref 0–99)
NonHDL: 89.17
Total CHOL/HDL Ratio: 3
Triglycerides: 117 mg/dL (ref 0.0–149.0)
VLDL: 23.4 mg/dL (ref 0.0–40.0)

## 2016-12-07 LAB — HEPATIC FUNCTION PANEL
ALBUMIN: 4.1 g/dL (ref 3.5–5.2)
ALT: 13 U/L (ref 0–53)
AST: 21 U/L (ref 0–37)
Alkaline Phosphatase: 65 U/L (ref 39–117)
BILIRUBIN TOTAL: 0.5 mg/dL (ref 0.2–1.2)
Bilirubin, Direct: 0.1 mg/dL (ref 0.0–0.3)
TOTAL PROTEIN: 6.7 g/dL (ref 6.0–8.3)

## 2016-12-07 LAB — BASIC METABOLIC PANEL
BUN: 23 mg/dL (ref 6–23)
CHLORIDE: 104 meq/L (ref 96–112)
CO2: 29 mEq/L (ref 19–32)
Calcium: 9.3 mg/dL (ref 8.4–10.5)
Creatinine, Ser: 1.19 mg/dL (ref 0.40–1.50)
GFR: 65.42 mL/min (ref >60.00)
GLUCOSE: 114 mg/dL — AB (ref 70–99)
POTASSIUM: 4.8 meq/L (ref 3.5–5.1)
SODIUM: 138 meq/L (ref 135–145)

## 2016-12-07 MED ORDER — METOPROLOL SUCCINATE ER 50 MG PO TB24: 50.0000 mg | ORAL_TABLET | Freq: Every day | ORAL | 3 refills | Status: DC

## 2016-12-07 MED ORDER — ATORVASTATIN CALCIUM 40 MG PO TABS
40.0000 mg | ORAL_TABLET | Freq: Every day | ORAL | 3 refills | Status: DC
Start: 2016-12-07 — End: 2018-09-16

## 2016-12-07 MED ORDER — VALSARTAN 160 MG PO TABS: 160.0000 mg | ORAL_TABLET | Freq: Every day | ORAL | 3 refills | Status: DC

## 2016-12-07 NOTE — Patient Instructions (Signed)
Monitor blood pressure and be in touch if consistent readings > 140 systolic.

## 2016-12-07 NOTE — Progress Notes (Signed)
Subjective:     Patient ID: Eugene Daniel, male   DOB: Mar 31, 1951, 66 y.o.   MRN: [id number]  HPI Patient is seen for medical follow-up. History of hypertension, hyperlipidemia, CAD. He had catheterization 2005 with LAD 75% stenosis in a small vessel and ejection fraction 45-50%. He has not seen cardiology a couple years. No recent chest pains. He states he is compliant with medications. Does not monitor blood pressures at home. No history of diabetes. No consistent exercise. Busy taking care of his 64 year old mother.  Past Medical History:  Diagnosis Date  . CORONARY ARTERY DISEASE    Cath 2005. LAD 75% stenosis in a small vessel. EF 45-50%.  Marland Kitchen. ELEVATED PROSTATE SPECIFIC ANTIGEN 01/01/2009  . HYPERLIPIDEMIA 01/01/2009  . Kidney stone    Past Surgical History:  Procedure Laterality Date  . TONSILLECTOMY      reports that he quit smoking about 2 years ago. His smoking use included Cigarettes. He has never used smokeless tobacco. He reports that he drinks alcohol. He reports that he does not use drugs. family history includes Heart disease in his paternal grandfather; Leukemia in his paternal grandfather. No Known Allergies   Review of Systems  Constitutional: Negative for fatigue.  Eyes: Negative for visual disturbance.  Respiratory: Negative for cough, chest tightness and shortness of breath.   Cardiovascular: Negative for chest pain, palpitations and leg swelling.  Endocrine: Negative for polydipsia and polyuria.  Neurological: Negative for dizziness, syncope, weakness, light-headedness and headaches.       Objective:   Physical Exam  Constitutional: He is oriented to person, place, and time. He appears well-developed and well-nourished.  HENT:  Right Ear: External ear normal.  Left Ear: External ear normal.  Mouth/Throat: Oropharynx is clear and moist.  Eyes: Pupils are equal, round, and reactive to light.  Neck: Neck supple. No thyromegaly present.  Cardiovascular: Normal  rate and regular rhythm.   Pulmonary/Chest: Effort normal and breath sounds normal. No respiratory distress. He has no wheezes. He has no rales.  Musculoskeletal: He exhibits no edema.  Neurological: He is alert and oriented to person, place, and time.       Assessment:     #1 hypertension with slightly suboptimal control today  #2 dyslipidemia  #3 history of CAD    Plan:     -Repeat labs with lipid panel, hepatic panel, basic metabolic panel -Refill all medications for one year -Discussed possible additional blood pressure medication. Patient first monitor first. Check home blood pressures closely next 3 months in follow-up in 3 months to reassess. If systolic not further improved at time consider either addition of HCTZ or amlodipine  Kristian CoveyBruce W Rhyder Bratz MD [facility]

## 2017-02-02 ENCOUNTER — Telehealth: Payer: Self-pay | Admitting: *Deleted

## 2017-02-02 NOTE — Telephone Encounter (Signed)
May change to losartan 100 mg once daily

## 2017-02-02 NOTE — Telephone Encounter (Signed)
valsartan (DIOVAN) 160 MG tablet is on backorder.  Alternative prescription requested.

## 2017-02-03 MED ORDER — LOSARTAN POTASSIUM 100 MG PO TABS: 100.0000 mg | ORAL_TABLET | Freq: Every day | ORAL | 3 refills | Status: DC

## 2017-03-08 ENCOUNTER — Encounter: Payer: Self-pay | Admitting: Family Medicine

## 2017-03-08 ENCOUNTER — Ambulatory Visit (INDEPENDENT_AMBULATORY_CARE_PROVIDER_SITE_OTHER): Payer: BLUE CROSS/BLUE SHIELD | Admitting: Family Medicine

## 2017-03-08 VITALS — BP 140/88 | HR 64 | Temp 97.8°F | Wt 201.6 lb

## 2017-03-08 DIAGNOSIS — E785 Hyperlipidemia, unspecified: Secondary | ICD-10-CM

## 2017-03-08 DIAGNOSIS — I10 Essential (primary) hypertension: Secondary | ICD-10-CM | POA: Diagnosis not present

## 2017-03-08 NOTE — Progress Notes (Signed)
Subjective:     Patient ID: Eugene Daniel, male   DOB: 1950-07-27, 66 y.o.   MRN: [id number]  HPI Patient seen for routine medical follow-up. He has history of CAD, hypertension, hyperlipidemia. He is retired and is staying busy with volunteer activities. Medications reviewed. Compliant with all. No side effects. No recent headaches, chest pains, dizziness, dyspnea.  He declines flu vaccine  Past Medical History:  Diagnosis Date  . CORONARY ARTERY DISEASE    Cath 2005. LAD 75% stenosis in a small vessel. EF 45-50%.  Marland Kitchen ELEVATED PROSTATE SPECIFIC ANTIGEN 01/01/2009  . HYPERLIPIDEMIA 01/01/2009  . Kidney stone    Past Surgical History:  Procedure Laterality Date  . TONSILLECTOMY      reports that he has been smoking Cigarettes.  He has never used smokeless tobacco. He reports that he drinks alcohol. He reports that he does not use drugs. family history includes Heart disease in his paternal grandfather; Leukemia in his paternal grandfather. No Known Allergies   Review of Systems  Constitutional: Negative for fatigue.  Eyes: Negative for visual disturbance.  Respiratory: Negative for cough, chest tightness and shortness of breath.   Cardiovascular: Negative for chest pain, palpitations and leg swelling.  Endocrine: Negative for polydipsia and polyuria.  Neurological: Negative for dizziness, syncope, weakness, light-headedness and headaches.       Objective:   Physical Exam  Constitutional: He is oriented to person, place, and time. He appears well-developed and well-nourished.  HENT:  Right Ear: External ear normal.  Left Ear: External ear normal.  Mouth/Throat: Oropharynx is clear and moist.  Eyes: Pupils are equal, round, and reactive to light.  Neck: Neck supple. No thyromegaly present.  Cardiovascular: Normal rate and regular rhythm.   Pulmonary/Chest: Effort normal and breath sounds normal. No respiratory distress. He has no wheezes. He has no rales.  Musculoskeletal: He  exhibits no edema.  Neurological: He is alert and oriented to person, place, and time.       Assessment:     #1 hypertension borderline elevated today with repeat after rest 138/68  #2 dyslipidemia-lipids were checked in July and stable    Plan:     -Discussed nonpharmacologic ways of helping manage hypertension. Increase aerobic activity such as walking -Monitor blood pressure closely at home and be in touch if consistently greater than 130/80 at home -Flu vaccine recommended and declined by patient -Routine follow-up in 6 months and sooner as needed  Kristian Covey MD [facility] at [facility]

## 2017-12-30 ENCOUNTER — Ambulatory Visit: Payer: Self-pay

## 2017-12-30 NOTE — Telephone Encounter (Signed)
Pt. Reports his son died 2 weeks ago and he helps care for his mother and has had increased stress. Yesterday had one episode of "chest tightness - not pain." No radiation. Lasted 2 minutes. No nausea, shortness of breath or diaphoresis. States "I just need to come in next week for a physical." Refuses an appointment this week. States he "had a heart attack 10 years ago." Physical scheduled for this coming Monday. Instructed to go to ED if chest pain comes back, lasts more than 5 minutes. Has shortness of breath,nausea,radiation of pain." Verbalizes understanding. Reason for Disposition . [1] Chest pain lasting <= 5 minutes AND [2] NO chest pain or cardiac symptoms now(Exceptions: pains lasting a few seconds)  Answer Assessment - Initial Assessment Questions 1. LOCATION: "Where does it hurt?"       Left of center 2. RADIATION: "Does the pain go anywhere else?" (e.g., into neck, jaw, arms, back)     No 3. ONSET: "When did the chest pain begin?" (Minutes, hours or days)      Yesterday 4. PATTERN "Does the pain come and go, or has it been constant since it started?"  "Does it get worse with exertion?"      Comes and goes 5. DURATION: "How long does it last" (e.g., seconds, minutes, hours)     2 MINUTES 6. SEVERITY: "How bad is the pain?"  (e.g., Scale 1-10; mild, moderate, or severe)    - MILD (1-3): doesn't interfere with normal activities     - MODERATE (4-7): interferes with normal activities or awakens from sleep    - SEVERE (8-10): excruciating pain, unable to do any normal activities       4 7. CARDIAC RISK FACTORS: "Do you have any history of heart problems or risk factors for heart disease?" (e.g., prior heart attack, angina; high blood pressure, diabetes, being overweight, high cholesterol, smoking, or strong family history of heart disease)     Heart attack 10 years ago 8. PULMONARY RISK FACTORS: "Do you have any history of lung disease?"  (e.g., blood clots in lung, asthma, emphysema,  birth control pills)     No 9. CAUSE: "What do you think is causing the chest pain?"     Axiety 10. OTHER SYMPTOMS: "Do you have any other symptoms?" (e.g., dizziness, nausea, vomiting, sweating, fever, difficulty breathing, cough)       No 11. PREGNANCY: "Is there any chance you are pregnant?" "When was your last menstrual period?"       N/A  Protocols used: CHEST PAIN-A-AH

## 2018-01-03 ENCOUNTER — Ambulatory Visit (INDEPENDENT_AMBULATORY_CARE_PROVIDER_SITE_OTHER): Payer: BLUE CROSS/BLUE SHIELD | Admitting: Family Medicine

## 2018-01-03 VITALS — BP 160/90 | HR 66 | Temp 98.3°F | Ht 72.0 in | Wt 190.5 lb

## 2018-01-03 DIAGNOSIS — Z0000 Encounter for general adult medical examination without abnormal findings: Secondary | ICD-10-CM | POA: Diagnosis not present

## 2018-01-03 DIAGNOSIS — E785 Hyperlipidemia, unspecified: Secondary | ICD-10-CM

## 2018-01-03 DIAGNOSIS — I10 Essential (primary) hypertension: Secondary | ICD-10-CM

## 2018-01-03 DIAGNOSIS — Z23 Encounter for immunization: Secondary | ICD-10-CM | POA: Diagnosis not present

## 2018-01-03 MED ORDER — VARENICLINE TARTRATE 0.5 MG X 11 & 1 MG X 42 PO MISC: ORAL | 0 refills | Status: DC

## 2018-01-03 MED ORDER — VARENICLINE TARTRATE 1 MG PO TABS: 1.0000 mg | ORAL_TABLET | Freq: Two times a day (BID) | ORAL | 1 refills | Status: DC

## 2018-01-03 MED ORDER — AMLODIPINE BESYLATE 5 MG PO TABS: 5.0000 mg | ORAL_TABLET | Freq: Every day | ORAL | 11 refills | Status: DC

## 2018-01-03 NOTE — Progress Notes (Signed)
Subjective:     Patient ID: Eugene HeysJoseph P Kadrmas, male   DOB: [birth year], 67 y.o.   MRN: [id number]  HPI Patient seen for physical exam. He has history of CAD, hypertension, hyperlipidemia. 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 [place]  He's had some recent increased stress issues. 65 year old son died 3 weeks ago of cerebral aneurysm in sleep. He's had some stress issues regarding work projects. Resumed smoking in the past year. Previously quit with Chantix and would like to try Chantix again.  Smokes about one half pack cigarettes per day. Less than 30 pack year history. No history of Prevnar. No history of hepatitis C screening. Has not been taking aspirin consistently. He is taking other medications regularly.  Medications reviewed.  BP treated with metoprolol and Losartan.  He takes Lipitor for hyperlipidemia.   No recent chest pains.  Past Medical History:  Diagnosis Date  . CORONARY ARTERY DISEASE    Cath 2005. LAD 75% stenosis in a small vessel. EF 45-50%.  Marland Kitchen. ELEVATED PROSTATE SPECIFIC ANTIGEN 01/01/2009  . HYPERLIPIDEMIA 01/01/2009  . Kidney stone    Past Surgical History:  Procedure Laterality Date  . TONSILLECTOMY      reports that he has been smoking cigarettes. He has never used smokeless tobacco. He reports that he drinks alcohol. He reports that he does not use drugs. family history includes Heart disease in his paternal grandfather; Leukemia in his paternal grandfather. No Known Allergies   Review of Systems  Constitutional: Negative for activity change, appetite change, fatigue and fever.  HENT: Negative for congestion, ear pain and trouble swallowing.   Eyes: Negative for pain and visual disturbance.  Respiratory: Negative for cough, shortness of breath and wheezing.   Cardiovascular: Negative for chest pain and palpitations.  Gastrointestinal: Negative for abdominal distention, abdominal pain, blood in  stool, constipation, diarrhea, nausea, rectal pain and vomiting.  Genitourinary: Negative for dysuria, hematuria and testicular pain.  Musculoskeletal: Negative for arthralgias and joint swelling.  Skin: Negative for rash.  Neurological: Negative for dizziness, syncope and headaches.  Hematological: Negative for adenopathy.  Psychiatric/Behavioral: Negative for confusion and dysphoric mood.       Objective:   Physical Exam  Constitutional: He is oriented to person, place, and time. He appears well-developed and well-nourished. No distress.  HENT:  Head: Normocephalic and atraumatic.  Right Ear: External ear normal.  Left Ear: External ear normal.  Mouth/Throat: Oropharynx is clear and moist.  Eyes: Pupils are equal, round, and reactive to light. Conjunctivae and EOM are normal.  Neck: Normal range of motion. Neck supple. No thyromegaly present.  Cardiovascular: Normal rate, regular rhythm and normal heart sounds.  Pulmonary/Chest: Effort normal. No stridor. No respiratory distress. He has no wheezes. He has no rales.  Abdominal: Soft. Bowel sounds are normal. He exhibits no distension and no mass. There is no tenderness. There is no rebound and no guarding.  Musculoskeletal: He exhibits no edema.  Lymphadenopathy:    He has no cervical adenopathy.  Neurological: He is alert and oriented to person, place, and time. He displays normal reflexes. No cranial nerve deficit.  Skin: No rash noted.  Psychiatric: He has a normal mood and affect.       Assessment:     #1 physical exam. Several health maintenance issues addressed as below  #2 hypertension poorly controlled with repeat reading 160/80. He also had borderline elevated reading last year this time  #  3 history of CAD with poor compliance with follow-up with cardiology  #4 ongoing nicotine use.    Plan:     -Strongly advise discontinue smoking. He would like to try Chantix again and prescription written to include starter  pack first and then maintenance pack for total of 3 months of treatment -We discussed low-dose CT lung cancer screening but he does not meet criteria in terms of less than 30 pack year history -Discussed new shingles vaccine and he will check on insurance coverage -Prevnar 13 given and recommended Pneumovax in 1 year -Obtain screening lab work. Include hepatitis C antibody. -Add amlodipine 5 mg daily to his current medications for better blood pressure control -Follow-up in one month to reassess blood pressure -We discussed possible abdominal aortic aneurysm screening and he will check on insurance coverage  Kristian CoveyBruce W Ajayla Iglesias MD [facility]

## 2018-01-03 NOTE — Patient Instructions (Signed)
Consider shingles vaccine (Shingrix) and check on insurance coverage if interested  Consider abdominal aortic aneurysm and check with insurance to see  if covered

## 2018-01-26 ENCOUNTER — Other Ambulatory Visit: Payer: Self-pay | Admitting: Family Medicine

## 2018-02-03 ENCOUNTER — Other Ambulatory Visit: Payer: Self-pay | Admitting: Family Medicine

## 2018-02-08 ENCOUNTER — Ambulatory Visit: Payer: BLUE CROSS/BLUE SHIELD | Admitting: Family Medicine

## 2018-02-08 ENCOUNTER — Encounter: Payer: Self-pay | Admitting: Family Medicine

## 2018-02-08 ENCOUNTER — Other Ambulatory Visit: Payer: Self-pay

## 2018-02-08 VITALS — BP 122/70 | HR 75 | Temp 98.5°F | Resp 16 | Ht 72.0 in | Wt 193.6 lb

## 2018-02-08 DIAGNOSIS — I10 Essential (primary) hypertension: Secondary | ICD-10-CM

## 2018-02-08 DIAGNOSIS — Z0000 Encounter for general adult medical examination without abnormal findings: Secondary | ICD-10-CM

## 2018-02-08 DIAGNOSIS — Z125 Encounter for screening for malignant neoplasm of prostate: Secondary | ICD-10-CM | POA: Diagnosis not present

## 2018-02-08 DIAGNOSIS — R430 Anosmia: Secondary | ICD-10-CM | POA: Diagnosis not present

## 2018-02-08 DIAGNOSIS — F17200 Nicotine dependence, unspecified, uncomplicated: Secondary | ICD-10-CM | POA: Diagnosis not present

## 2018-02-08 LAB — CBC WITH DIFFERENTIAL/PLATELET
BASOS ABS: 0.1 10*3/uL (ref 0.0–0.1)
Basophils Relative: 0.6 % (ref 0.0–3.0)
Eosinophils Absolute: 0.2 10*3/uL (ref 0.0–0.7)
Eosinophils Relative: 1.8 % (ref 0.0–5.0)
HCT: 45 % (ref 39.0–52.0)
HEMOGLOBIN: 15.7 g/dL (ref 13.0–17.0)
Lymphocytes Relative: 21.2 % (ref 12.0–46.0)
Lymphs Abs: 1.9 10*3/uL (ref 0.7–4.0)
MCHC: 34.8 g/dL (ref 30.0–36.0)
MCV: 87.1 fl (ref 78.0–100.0)
MONO ABS: 0.5 10*3/uL (ref 0.1–1.0)
MONOS PCT: 5.9 % (ref 3.0–12.0)
Neutro Abs: 6.4 10*3/uL (ref 1.4–7.7)
Neutrophils Relative %: 70.5 % (ref 43.0–77.0)
Platelets: 212 10*3/uL (ref 150.0–400.0)
RBC: 5.17 Mil/uL (ref 4.22–5.81)
RDW: 13.5 % (ref 11.5–15.5)
WBC: 9 10*3/uL (ref 4.0–10.5)

## 2018-02-08 LAB — BASIC METABOLIC PANEL
BUN: 28 mg/dL — ABNORMAL HIGH (ref 6–23)
CALCIUM: 9.7 mg/dL (ref 8.4–10.5)
CO2: 30 mEq/L (ref 19–32)
CREATININE: 1.21 mg/dL (ref 0.40–1.50)
Chloride: 102 mEq/L (ref 96–112)
GFR: 63.94 mL/min (ref >60.00)
Glucose, Bld: 132 mg/dL — ABNORMAL HIGH (ref 70–99)
Potassium: 4.2 mEq/L (ref 3.5–5.1)
SODIUM: 140 meq/L (ref 135–145)

## 2018-02-08 LAB — HEPATIC FUNCTION PANEL
ALBUMIN: 4.2 g/dL (ref 3.5–5.2)
ALT: 9 U/L (ref 0–53)
AST: 14 U/L (ref 0–37)
Alkaline Phosphatase: 66 U/L (ref 39–117)
Bilirubin, Direct: 0.1 mg/dL (ref 0.0–0.3)
Total Bilirubin: 0.7 mg/dL (ref 0.2–1.2)
Total Protein: 6.8 g/dL (ref 6.0–8.3)

## 2018-02-08 LAB — LIPID PANEL
CHOLESTEROL: 122 mg/dL (ref 0–200)
HDL: 32.5 mg/dL — AB (ref >39.00)
LDL CALC: 58 mg/dL (ref 0–99)
NonHDL: 89.12
Total CHOL/HDL Ratio: 4
Triglycerides: 157 mg/dL — ABNORMAL HIGH (ref 0.0–149.0)
VLDL: 31.4 mg/dL (ref 0.0–40.0)

## 2018-02-08 LAB — TSH: TSH: 1.9 u[IU]/mL (ref 0.35–4.50)

## 2018-02-08 LAB — PSA: PSA: 2.71 ng/mL (ref 0.10–4.00)

## 2018-02-08 NOTE — Patient Instructions (Signed)
Check with insurance to see if they will cover for abdominal aortic aneurysm screen  We will call you with lab results.  We are setting up CT head to evaluate loss of smell and taste.

## 2018-02-08 NOTE — Progress Notes (Signed)
  Subjective:     Patient ID: Eugene Daniel, male   DOB: 1951-04-13, 67 y.o.   MRN: [id number]  HPI Patient seen for medical follow-up. Has history of CAD, hypertension, hyperlipidemia. Was seen recently for physical and had elevated blood pressure 160/90. We added amlodipine 5 mg daily. Compliant with therapy. He had some initial lightheadedness but those symptoms have resolved. No headaches. No peripheral edema. Blood pressures been much improved. Consistently less than 140 systolic. No chest pains.  Patient relates six-month history of almost total loss of smell and taste. No headaches. Denies any nasal congestion. No sinusitis symptoms. No facial pain. No history of nasal polyps. No new medications.  No history of head trauma.  Long history of smoking. He has started back Chantix and is doing well with that thus far  Past Medical History:  Diagnosis Date  . CORONARY ARTERY DISEASE    Cath 2005. LAD 75% stenosis in a small vessel. EF 45-50%.  Marland Kitchen. ELEVATED PROSTATE SPECIFIC ANTIGEN 01/01/2009  . HYPERLIPIDEMIA 01/01/2009  . Kidney stone    Past Surgical History:  Procedure Laterality Date  . TONSILLECTOMY      reports that he has been smoking cigarettes. He has never used smokeless tobacco. He reports that he drinks alcohol. He reports that he does not use drugs. family history includes Heart disease in his paternal grandfather; Leukemia in his paternal grandfather. No Known Allergies   Review of Systems  Constitutional: Negative for fatigue.  HENT: Negative for congestion, nosebleeds, postnasal drip, rhinorrhea, sinus pressure, sinus pain and sneezing.   Eyes: Negative for visual disturbance.  Respiratory: Negative for cough, chest tightness and shortness of breath.   Cardiovascular: Negative for chest pain, palpitations and leg swelling.  Neurological: Negative for dizziness, syncope, weakness, light-headedness and headaches.       Objective:   Physical Exam  Constitutional:  He is oriented to person, place, and time. He appears well-developed and well-nourished.  HENT:  Right Ear: External ear normal.  Left Ear: External ear normal.  Nose: Nose normal.  Mouth/Throat: Oropharynx is clear and moist.  Neck: Neck supple.  Cardiovascular: Normal rate and regular rhythm.  Pulmonary/Chest: Effort normal and breath sounds normal.  Lymphadenopathy:    He has no cervical adenopathy.  Neurological: He is alert and oriented to person, place, and time. No cranial nerve deficit.       Assessment:     #1 hypertension greatly improved with recent addition of amlodipine  #2 history of nicotine use. Patient currently is using Chantix and doing well  #3    6 month history of reported anosmia.  He does not have any obvious nasal congestion. No headaches.    Plan:     -continue blood pressure regimen with losartan, amlodipine, and metoprolol -He is again encouraged to check with insurance regarding whether he has coverage for abdominal aortic aneurysm screening -Go ahead and obtain labs today which were ordered last visit -Set of CT head without contrast for further evaluation of his 6 month history of loss of smell and taste  Kristian CoveyBruce W Bridie Colquhoun MD [facility]

## 2018-02-10 LAB — HEPATITIS C ANTIBODY
HEP C AB: NONREACTIVE
SIGNAL TO CUT-OFF: 0.02 (ref <1.00)

## 2018-02-15 ENCOUNTER — Ambulatory Visit (INDEPENDENT_AMBULATORY_CARE_PROVIDER_SITE_OTHER)
Admission: RE | Admit: 2018-02-15 | Discharge: 2018-02-15 | Disposition: A | Discharge status: Home / Self Care | Payer: BLUE CROSS/BLUE SHIELD | Source: Ambulatory Visit | Attending: Family Medicine | Admitting: Family Medicine

## 2018-02-15 DIAGNOSIS — R430 Anosmia: Secondary | ICD-10-CM | POA: Diagnosis not present

## 2018-02-26 ENCOUNTER — Other Ambulatory Visit: Payer: Self-pay | Admitting: Family Medicine

## 2018-02-27 ENCOUNTER — Other Ambulatory Visit: Payer: Self-pay | Admitting: Family Medicine

## 2018-02-28 NOTE — Telephone Encounter (Signed)
Should already have one refill.

## 2018-02-28 NOTE — Telephone Encounter (Signed)
Last OV 02/08/18, Next OV 05/16/18  Last filled 01/26/18, # 60 with 1 refill  OK to continue this strength and dosage?

## 2018-04-14 ENCOUNTER — Other Ambulatory Visit: Payer: Self-pay | Admitting: Family Medicine

## 2018-05-11 ENCOUNTER — Other Ambulatory Visit: Payer: Self-pay | Admitting: Family Medicine

## 2018-05-16 ENCOUNTER — Other Ambulatory Visit: Payer: Self-pay | Admitting: Family Medicine

## 2018-05-16 ENCOUNTER — Ambulatory Visit: Payer: BLUE CROSS/BLUE SHIELD | Admitting: Family Medicine

## 2018-05-30 ENCOUNTER — Other Ambulatory Visit: Payer: Self-pay | Admitting: Family Medicine

## 2018-05-31 NOTE — Telephone Encounter (Signed)
He should not be on this > 3 months.

## 2018-05-31 NOTE — Telephone Encounter (Signed)
Last OV 02/08/18, Next OV 08/16/18  Started on 01/03/18 with OV note to be on for 3 months  Are you continuing this medication? Please advise if OK to fill or Decline.

## 2018-06-01 ENCOUNTER — Other Ambulatory Visit: Payer: Self-pay | Admitting: Family Medicine

## 2018-08-07 ENCOUNTER — Other Ambulatory Visit: Payer: Self-pay | Admitting: Family Medicine

## 2018-08-15 ENCOUNTER — Telehealth: Payer: Self-pay

## 2018-08-15 NOTE — Telephone Encounter (Signed)
Called patient and he stated that he thought he was having his physical done and I have made his appointment for 01/04/19 at 9am for his physical. Patient stated that he was doing well and not having any issues.

## 2018-08-15 NOTE — Telephone Encounter (Signed)
Copied from CRM [id number]. Topic: Appointment Scheduling - Scheduling Inquiry for Clinic >> Aug 15, 2018  7:54 AM Fanny Bien wrote: Reason for CRM: pt canceled appointment on 08/16/18 and would be interested in webex visit. Please advise

## 2018-08-16 ENCOUNTER — Ambulatory Visit: Payer: BLUE CROSS/BLUE SHIELD | Admitting: Family Medicine

## 2018-09-16 ENCOUNTER — Other Ambulatory Visit: Payer: Self-pay | Admitting: Family Medicine

## 2018-09-16 NOTE — Telephone Encounter (Signed)
Requested Prescriptions  Pending Prescriptions Disp Refills  . atorvastatin (LIPITOR) 40 MG tablet [Pharmacy Med Name: ATORVASTATIN 40 MG TABLET] 90 tablet 1    Sig: TAKE 1 TABLET BY MOUTH DAILY AT 6 PM.     Cardiovascular:  Antilipid - Statins Failed - 09/16/2018 10:34 AM      Failed - HDL in normal range and within 360 days    HDL  Date Value Ref Range Status  02/08/2018 32.50 (L) >39.00 mg/dL Final         Failed - Triglycerides in normal range and within 360 days    Triglycerides  Date Value Ref Range Status  02/08/2018 157.0 (H) 0.0 - 149.0 mg/dL Final    Comment:    Normal:  <150 mg/dLBorderline High:  150 - 199 mg/dL         Passed - Total Cholesterol in normal range and within 360 days    Cholesterol  Date Value Ref Range Status  02/08/2018 122 0 - 200 mg/dL Final    Comment:    ATP III Classification       Desirable:  < 200 mg/dL               Borderline High:  200 - 239 mg/dL          High:  > = 254 mg/dL         Passed - LDL in normal range and within 360 days    LDL Cholesterol  Date Value Ref Range Status  02/08/2018 58 0 - 99 mg/dL Final         Passed - Patient is not pregnant      Passed - Valid encounter within last 12 months    Recent Outpatient Visits          7 months ago Essential hypertension   [facility], Elberta Fortis, MD   8 months ago Physical exam   [facility], Elberta Fortis, MD   1 year ago Essential hypertension   [facility], Elberta Fortis, MD   1 year ago Essential hypertension   [facility], Elberta Fortis, MD   3 years ago Essential hypertension   [facility], Elberta Fortis, MD      Future Appointments            In 3 months Burchette, Elberta Fortis, MD [facility] [place], [facility]

## 2019-01-04 ENCOUNTER — Encounter: Payer: BLUE CROSS/BLUE SHIELD | Admitting: Family Medicine

## 2019-01-04 DIAGNOSIS — Z0289 Encounter for other administrative examinations: Secondary | ICD-10-CM

## 2019-02-06 ENCOUNTER — Other Ambulatory Visit: Payer: Self-pay

## 2019-02-06 ENCOUNTER — Encounter: Payer: Self-pay | Admitting: Family Medicine

## 2019-02-06 ENCOUNTER — Ambulatory Visit (INDEPENDENT_AMBULATORY_CARE_PROVIDER_SITE_OTHER): Payer: Medicare HMO | Admitting: Family Medicine

## 2019-02-06 VITALS — BP 150/84 | HR 69 | Temp 98.0°F | Wt 208.2 lb

## 2019-02-06 DIAGNOSIS — I2510 Atherosclerotic heart disease of native coronary artery without angina pectoris: Secondary | ICD-10-CM

## 2019-02-06 DIAGNOSIS — I10 Essential (primary) hypertension: Secondary | ICD-10-CM | POA: Diagnosis not present

## 2019-02-06 DIAGNOSIS — E785 Hyperlipidemia, unspecified: Secondary | ICD-10-CM

## 2019-02-06 DIAGNOSIS — R739 Hyperglycemia, unspecified: Secondary | ICD-10-CM

## 2019-02-06 DIAGNOSIS — Z23 Encounter for immunization: Secondary | ICD-10-CM | POA: Diagnosis not present

## 2019-02-06 DIAGNOSIS — Z0000 Encounter for general adult medical examination without abnormal findings: Secondary | ICD-10-CM

## 2019-02-06 DIAGNOSIS — R972 Elevated prostate specific antigen [PSA]: Secondary | ICD-10-CM | POA: Diagnosis not present

## 2019-02-06 LAB — BASIC METABOLIC PANEL
BUN: 25 mg/dL — ABNORMAL HIGH (ref 6–23)
CO2: 28 mEq/L (ref 19–32)
Calcium: 9.4 mg/dL (ref 8.4–10.5)
Chloride: 103 mEq/L (ref 96–112)
Creatinine, Ser: 1.2 mg/dL (ref 0.40–1.50)
GFR: 60.55 mL/min (ref >60.00)
Glucose, Bld: 87 mg/dL (ref 70–99)
Potassium: 4.9 mEq/L (ref 3.5–5.1)
Sodium: 139 mEq/L (ref 135–145)

## 2019-02-06 LAB — CBC WITH DIFFERENTIAL/PLATELET
Basophils Absolute: 0.1 10*3/uL (ref 0.0–0.1)
Basophils Relative: 0.7 % (ref 0.0–3.0)
Eosinophils Absolute: 0.2 10*3/uL (ref 0.0–0.7)
Eosinophils Relative: 1.9 % (ref 0.0–5.0)
HCT: 45.9 % (ref 39.0–52.0)
Hemoglobin: 15.8 g/dL (ref 13.0–17.0)
Lymphocytes Relative: 19.4 % (ref 12.0–46.0)
Lymphs Abs: 1.9 10*3/uL (ref 0.7–4.0)
MCHC: 34.3 g/dL (ref 30.0–36.0)
MCV: 90.8 fl (ref 78.0–100.0)
Monocytes Absolute: 0.6 10*3/uL (ref 0.1–1.0)
Monocytes Relative: 6.3 % (ref 3.0–12.0)
Neutro Abs: 7 10*3/uL (ref 1.4–7.7)
Neutrophils Relative %: 71.7 % (ref 43.0–77.0)
Platelets: 165 10*3/uL (ref 150.0–400.0)
RBC: 5.06 Mil/uL (ref 4.22–5.81)
RDW: 13.2 % (ref 11.5–15.5)
WBC: 9.7 10*3/uL (ref 4.0–10.5)

## 2019-02-06 LAB — TSH: TSH: 2.44 u[IU]/mL (ref 0.35–4.50)

## 2019-02-06 LAB — LIPID PANEL
Cholesterol: 141 mg/dL (ref 0–200)
HDL: 33.7 mg/dL — ABNORMAL LOW (ref >39.00)
LDL Cholesterol: 72 mg/dL (ref 0–99)
NonHDL: 106.82
Total CHOL/HDL Ratio: 4
Triglycerides: 175 mg/dL — ABNORMAL HIGH (ref 0.0–149.0)
VLDL: 35 mg/dL (ref 0.0–40.0)

## 2019-02-06 LAB — HEPATIC FUNCTION PANEL
ALT: 16 U/L (ref 0–53)
AST: 18 U/L (ref 0–37)
Albumin: 4.3 g/dL (ref 3.5–5.2)
Alkaline Phosphatase: 56 U/L (ref 39–117)
Bilirubin, Direct: 0.1 mg/dL (ref 0.0–0.3)
Total Bilirubin: 1 mg/dL (ref 0.2–1.2)
Total Protein: 6.8 g/dL (ref 6.0–8.3)

## 2019-02-06 LAB — HEMOGLOBIN A1C: Hgb A1c MFr Bld: 6.2 % (ref 4.6–6.5)

## 2019-02-06 LAB — PSA, MEDICARE: PSA: 2.19 ng/ml (ref 0.10–4.00)

## 2019-02-06 MED ORDER — AMLODIPINE BESYLATE 10 MG PO TABS: 10.0000 mg | ORAL_TABLET | Freq: Every day | ORAL | 3 refills | Status: DC

## 2019-02-06 NOTE — Progress Notes (Addendum)
Subjective:     Patient ID: Eugene HeysJoseph P Fosse, male   DOB: 28-Nov-1950, 68 y.o.   MRN: [id number]  HPI Patient is here for physical exam.  He just retired within the past year.  He has done hotel management for years.  He is helping to take care of his mother currently and stays very busy with yard work.  He has history of hyperlipidemia, history of CAD, hypertension, history of kidney stones.  Still smoking but very lightly- just usually a few cigarettes per week.  He does need Pneumovax and also flu vaccine.  No history of abdominal aortic aneurysm screen.  He states he had Shingrix at the pharmacy last year.  He has less than 30-pack-year history of cigarette smoking.  He did have elevated glucose last year physical 132 but not clear if this was fasting.  Medications reviewed.  He states he is compliant with all.  Not monitoring blood pressures regularly except through some type of phone app but he is unsure about accuracy.  Denies any recent chest pains.  Past Medical History:  Diagnosis Date  . CORONARY ARTERY DISEASE    Cath 2005. LAD 75% stenosis in a small vessel. EF 45-50%.  Marland Kitchen. ELEVATED PROSTATE SPECIFIC ANTIGEN 01/01/2009  . HYPERLIPIDEMIA 01/01/2009  . Kidney stone    Past Surgical History:  Procedure Laterality Date  . TONSILLECTOMY      reports that he has been smoking cigarettes. He has never used smokeless tobacco. He reports current alcohol use. He reports that he does not use drugs. family history includes Heart disease in his paternal grandfather; Leukemia in his paternal grandfather. No Known Allergies   Review of Systems  Constitutional: Negative for fatigue and unexpected weight change.  Eyes: Negative for visual disturbance.  Respiratory: Negative for cough, chest tightness and shortness of breath.   Cardiovascular: Negative for chest pain, palpitations and leg swelling.  Gastrointestinal: Negative for abdominal pain, blood in stool, nausea and vomiting.  Endocrine:  Negative for polydipsia and polyuria.  Genitourinary: Negative for dysuria.  Neurological: Negative for dizziness, syncope, weakness, light-headedness and headaches.       Objective:   Physical Exam Constitutional:      Appearance: Normal appearance.  HENT:     Right Ear: Tympanic membrane normal.     Left Ear: Tympanic membrane normal.     Mouth/Throat:     Mouth: Mucous membranes are moist.     Pharynx: Oropharynx is clear.  Neck:     Musculoskeletal: Neck supple.  Cardiovascular:     Rate and Rhythm: Normal rate and regular rhythm.  Pulmonary:     Effort: Pulmonary effort is normal.     Breath sounds: Normal breath sounds. No rales.  Abdominal:     Palpations: Abdomen is soft. There is no mass.     Tenderness: There is no abdominal tenderness. There is no guarding or rebound.  Musculoskeletal:     Right lower leg: No edema.     Left lower leg: No edema.  Lymphadenopathy:     Cervical: No cervical adenopathy.  Skin:    Findings: No rash.     Comments: He has some scattered dermal nevi but no concerning lesions  Neurological:     General: No focal deficit present.     Mental Status: He is alert and oriented to person, place, and time.        Assessment:     #1 physical exam.  We discussed several health maintenance issues as  below  #2 hypertension suboptimally controlled  #3 hyperlipidemia- hx of CAD- goal LDL < 70.  #4 hx of hyperglycemia.    Plan:     -Flu vaccine and Pneumovax given.  Continue with yearly flu vaccine -Patient reportedly had Shingrix vaccine series back in 2019 -Discussed smoking cessation.  We also discussed CT lung cancer screening but he does not meet criteria in terms of pack-year history with approximately 15-pack-year history -Discussed abdominal aortic aneurysm screening -Increase amlodipine to 10 mg daily and bring back in 2 months to reassess blood pressure -Obtain follow-up labs.  Will include A1c with mildly elevated glucose  last year - check the following labs: Lipids and hepatic (hx of hyperlipidemia and CAD) BMP and A1C ( hx of hyperglycemia) PSA (hx of elevated PSA)    Eulas Post MD [facility] at [facility]

## 2019-03-08 ENCOUNTER — Ambulatory Visit: Payer: Medicare HMO

## 2019-03-10 ENCOUNTER — Telehealth: Payer: Self-pay | Admitting: *Deleted

## 2019-03-10 ENCOUNTER — Ambulatory Visit
Admission: RE | Admit: 2019-03-10 | Discharge: 2019-03-10 | Disposition: A | Discharge status: Home / Self Care | Payer: Medicare HMO | Source: Ambulatory Visit | Attending: Family Medicine | Admitting: Family Medicine

## 2019-03-10 DIAGNOSIS — Z136 Encounter for screening for cardiovascular disorders: Secondary | ICD-10-CM | POA: Diagnosis not present

## 2019-03-10 DIAGNOSIS — Z87891 Personal history of nicotine dependence: Secondary | ICD-10-CM | POA: Diagnosis not present

## 2019-03-10 DIAGNOSIS — Z0000 Encounter for general adult medical examination without abnormal findings: Secondary | ICD-10-CM

## 2019-03-10 NOTE — Telephone Encounter (Signed)
Copied from [place] [id number]. Topic: General - Other >> Mar 10, 2019 10:08 AM Antonieta Iba C wrote: Reason for CRM: pt would like to know if office would mail out a DPR to him at his address on file?   Please assist

## 2019-03-10 NOTE — Telephone Encounter (Signed)
Copied from [place] [id number]. Topic: Complaint - Billing/Coding >> Mar 10, 2019  3:44 PM Rutherford Nail, [place] wrote: DOS: 01/04/2019 Details of complaint: Patient wife calling and states that patient received a $50 bill for a No Show fee.  How would the patient like to see this issue resolved? Would like fee dissolved.    Will automatically be routed to [place] pool.

## 2019-03-13 NOTE — Telephone Encounter (Signed)
DPR was placed in outgoing mail

## 2019-03-17 NOTE — Telephone Encounter (Signed)
Pt has no DPR so no permission to speak with anyone other than pt.   LMTCB to advise this appt was an actual No Show as pt did not come in to office for appt. We can remove this fee as a one-time courtesy. Email has been sent to charge correction to have this completed.  PEC may advise PATIENT only.

## 2019-03-19 ENCOUNTER — Other Ambulatory Visit: Payer: Self-pay | Admitting: Family Medicine

## 2019-03-31 ENCOUNTER — Telehealth: Payer: Self-pay | Admitting: *Deleted

## 2019-03-31 NOTE — Telephone Encounter (Signed)
Copied from [place] [id number]. Topic: General - Inquiry >> Mar 31, 2019  2:35 PM Richardo Priest, NT wrote: Reason for CRM: Pt called in stating he is returning a call from [place]. Pt would like to discuss a few things with her. Please advise.

## 2019-03-31 NOTE — Telephone Encounter (Signed)
Attempted to reach pt to find out what questions he had. No answer. Left vm to call back

## 2019-03-31 NOTE — Telephone Encounter (Signed)
Spoke with pt who wanted me to inform wife about result. She also wanted to assure does pt still have to see the provider for his annual physicals. Informed her that this has not changed, that there was nothing noting that he is not to return for that. They had no additional questions. Nothing further is needed

## 2019-04-10 ENCOUNTER — Ambulatory Visit: Payer: Medicare HMO | Admitting: Family Medicine

## 2019-04-17 DIAGNOSIS — R69 Illness, unspecified: Secondary | ICD-10-CM | POA: Diagnosis not present

## 2019-04-18 ENCOUNTER — Telehealth: Payer: Self-pay

## 2019-04-18 NOTE — Telephone Encounter (Signed)
Can you help with this? I see patient had an appointment scheduled on 04/10/19 and it says no show.   Copied from [place] [id number]. Topic: General - Other >> Apr 18, 2019  3:33 PM Celene Kras wrote: Reason for CRM: Pt called stating he got an email stating his appt would be virtual which is why he missed the appt. Please advise.

## 2019-04-19 NOTE — Telephone Encounter (Signed)
IF he was truly notified that appt would be virtual (not sure if we can confirm that) would not charge as a "no show"

## 2019-04-19 NOTE — Telephone Encounter (Signed)
Patient did not read the My Chart message: Messages sent to: [facility]     Delivery Read From Message Type Attachments Subject    04/07/2019 6:04 AM N Mychart, Generic Patient New Appointment  Appointment Reminder        Also, patient did not call regarding missed appointment until letter was sent. Please advise on charging No Show Fee. This is patient's 3rd No Show per appointment desk.

## 2019-05-01 NOTE — Telephone Encounter (Signed)
Appt marked as "canceled" since pt states he thought visit would be virtual. No fee should be charged.

## 2019-05-09 ENCOUNTER — Other Ambulatory Visit: Payer: Self-pay | Admitting: Family Medicine

## 2019-06-22 ENCOUNTER — Other Ambulatory Visit: Payer: Self-pay | Admitting: Family Medicine

## 2019-09-11 ENCOUNTER — Other Ambulatory Visit: Payer: Self-pay | Admitting: Family Medicine

## 2019-10-26 DIAGNOSIS — R69 Illness, unspecified: Secondary | ICD-10-CM | POA: Diagnosis not present

## 2019-10-26 DIAGNOSIS — N182 Chronic kidney disease, stage 2 (mild): Secondary | ICD-10-CM | POA: Diagnosis not present

## 2019-12-06 ENCOUNTER — Other Ambulatory Visit: Payer: Self-pay | Admitting: Family Medicine

## 2019-12-24 ENCOUNTER — Other Ambulatory Visit: Payer: Self-pay | Admitting: Family Medicine

## 2020-01-16 ENCOUNTER — Other Ambulatory Visit: Payer: Self-pay | Admitting: Family Medicine

## 2020-01-27 ENCOUNTER — Other Ambulatory Visit: Payer: Self-pay | Admitting: Family Medicine

## 2020-02-20 ENCOUNTER — Other Ambulatory Visit: Payer: Self-pay | Admitting: Family Medicine

## 2020-03-17 ENCOUNTER — Other Ambulatory Visit: Payer: Self-pay | Admitting: Family Medicine

## 2020-03-28 DIAGNOSIS — H5211 Myopia, right eye: Secondary | ICD-10-CM | POA: Diagnosis not present

## 2020-04-09 ENCOUNTER — Other Ambulatory Visit: Payer: Self-pay | Admitting: Family Medicine

## 2020-04-22 ENCOUNTER — Telehealth: Payer: Self-pay | Admitting: Family Medicine

## 2020-04-22 MED ORDER — LOSARTAN POTASSIUM 100 MG PO TABS
100.0000 mg | ORAL_TABLET | Freq: Every day | ORAL | 1 refills | Status: DC
Start: 2020-04-22 — End: 2020-12-02

## 2020-04-22 NOTE — Telephone Encounter (Signed)
pt  requesting a refill on  losartan (COZAAR) 100 MG tablet [id number] - [place], [place] - [address] AT [address]CROSS FROM [address]  Phone:  [phone] Fax:  [phone]  do not need the metoprolol succinate (TOPROL-XL) 50 MG 24 hr tablet

## 2020-04-22 NOTE — Telephone Encounter (Signed)
To add with the pervious message   Pt need 30 day  With 3 refill - also need  amLODipine (NORVASC) 10 MG tablet Losartan  atorvastatin (LIPITOR) 40 MG tablet

## 2020-04-22 NOTE — Telephone Encounter (Signed)
Rx sent in

## 2020-04-25 DIAGNOSIS — Z809 Family history of malignant neoplasm, unspecified: Secondary | ICD-10-CM | POA: Diagnosis not present

## 2020-04-25 DIAGNOSIS — Z87891 Personal history of nicotine dependence: Secondary | ICD-10-CM | POA: Diagnosis not present

## 2020-04-25 DIAGNOSIS — I10 Essential (primary) hypertension: Secondary | ICD-10-CM | POA: Diagnosis not present

## 2020-04-25 DIAGNOSIS — E785 Hyperlipidemia, unspecified: Secondary | ICD-10-CM | POA: Diagnosis not present

## 2020-04-29 DIAGNOSIS — R69 Illness, unspecified: Secondary | ICD-10-CM | POA: Diagnosis not present

## 2020-05-07 ENCOUNTER — Other Ambulatory Visit: Payer: Self-pay | Admitting: Family Medicine

## 2020-05-20 ENCOUNTER — Telehealth: Payer: Self-pay | Admitting: Family Medicine

## 2020-05-20 NOTE — Telephone Encounter (Signed)
Left message for patient to call back and schedule Medicare Annual Wellness Visit (AWV) either virtually or in office.   Last AWV no information please schedule at anytime with [facility] Nurse Health Advisor 1 or 2   This should be a 45 minute visit. 

## 2020-05-21 DIAGNOSIS — H40033 Anatomical narrow angle, bilateral: Secondary | ICD-10-CM | POA: Diagnosis not present

## 2020-05-21 DIAGNOSIS — H2513 Age-related nuclear cataract, bilateral: Secondary | ICD-10-CM | POA: Diagnosis not present

## 2020-05-22 IMAGING — CT CT HEAD W/O CM
3 series · 14 of 33 positions shown, 17 images · non-contrast
Comparison: None.

CLINICAL DATA: Anosmia for 6 months.  No history of injury.

EXAM:
CT HEAD WITHOUT CONTRAST
TECHNIQUE: Contiguous axial images were obtained from the base of the skull
through the vertex without intravenous contrast.

[Series 2: head 5.0 h37s · axial · 0.41mm/px · z∈[+1534,+1639]mm · 6 of 29 slices shown, 8 images]
[im 5/29  soft-tissue]
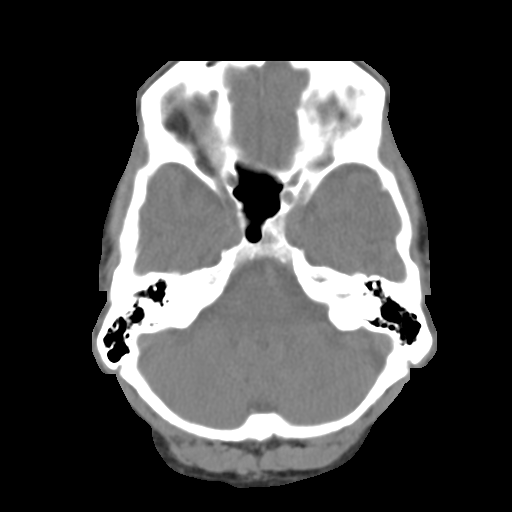
[im 5/29  bone]
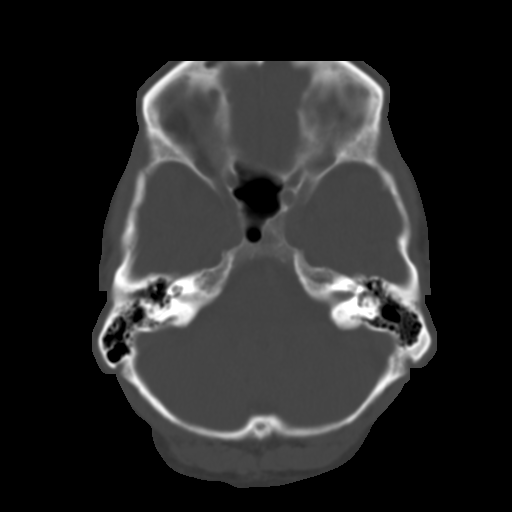
[im 9/29  bone]
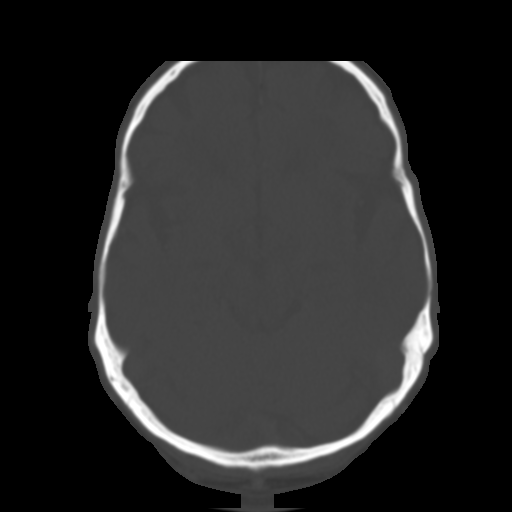
[im 13/29  bone]
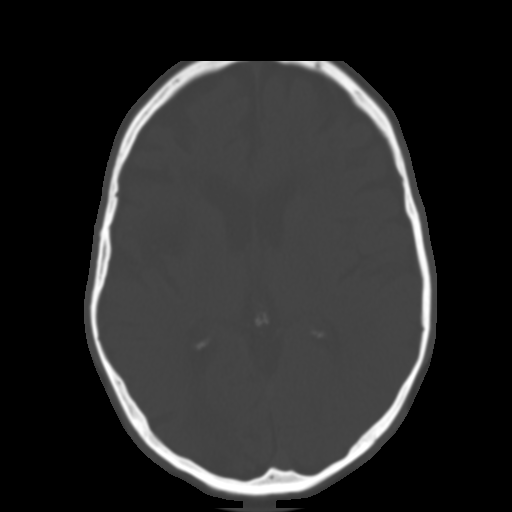
[im 18/29  bone]
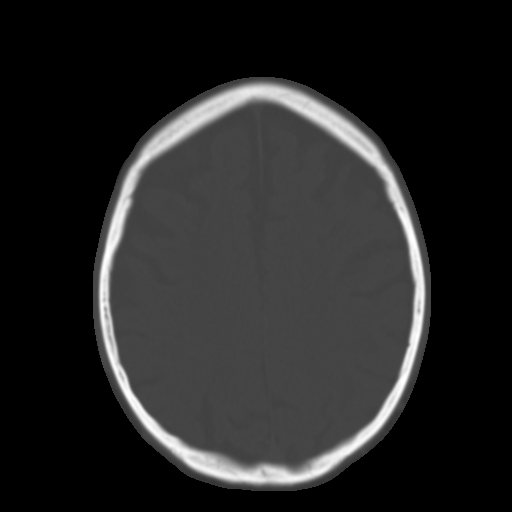
[im 22/29  soft-tissue]
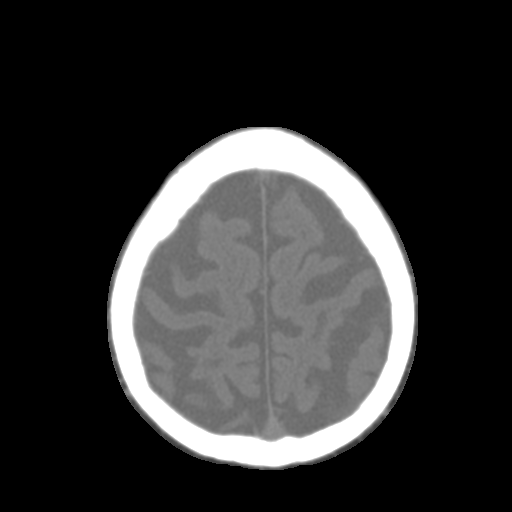
[im 22/29  bone]
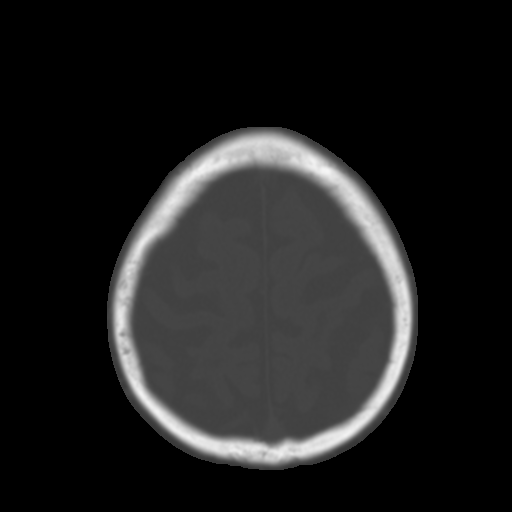
[im 26/29  bone]
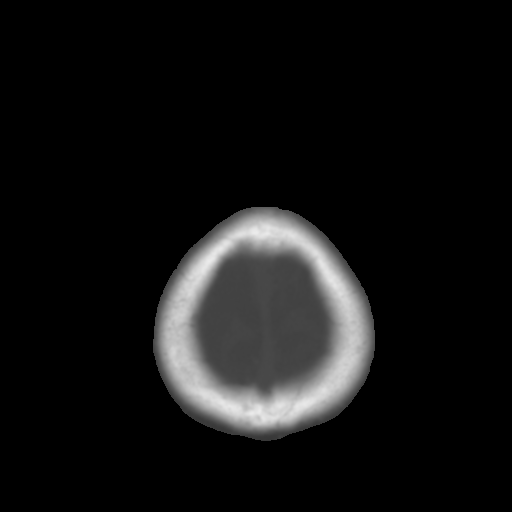

[Series 4: head 3.0 mpr cor · coronal · 0.30mm/px · 3 of 67 slices shown]
[im 14/67  bone]
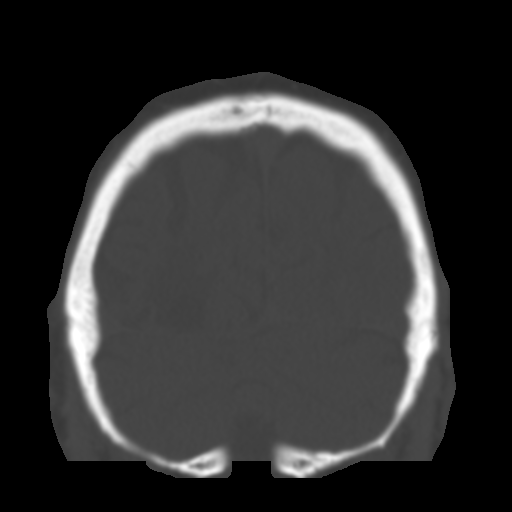
[im 27/67  bone]
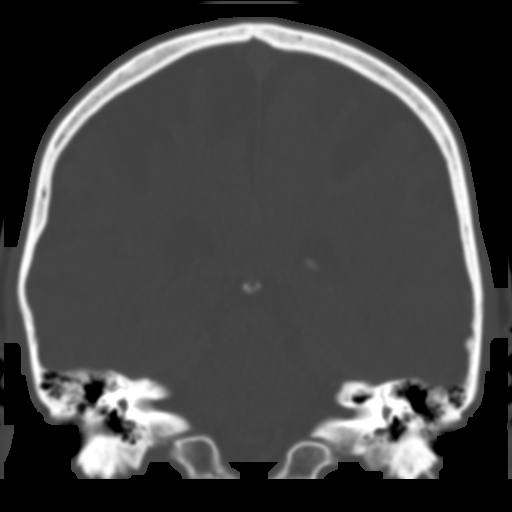
[im 40/67  bone]
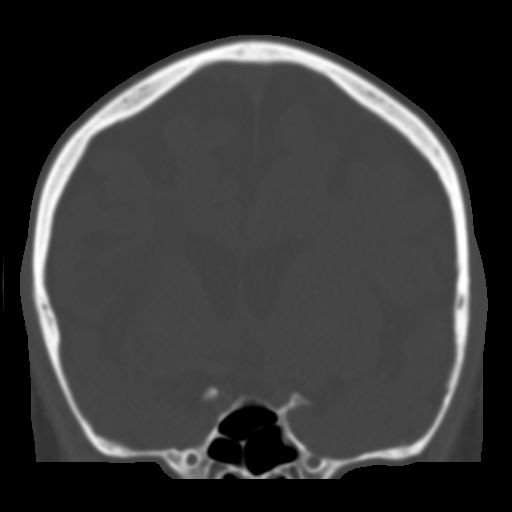

[Series 5: head 3.0 mpr sag · sagittal · 0.28mm/px · 5 of 54 slices shown, 6 images]
[im 18/54  bone]
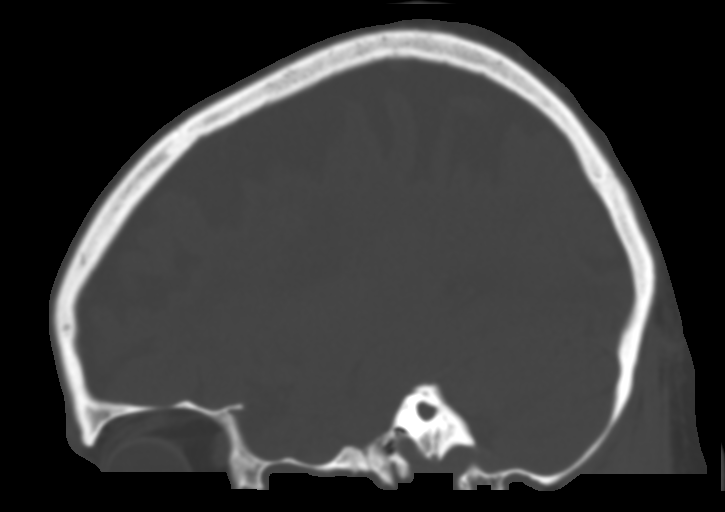
[im 23/54  bone]
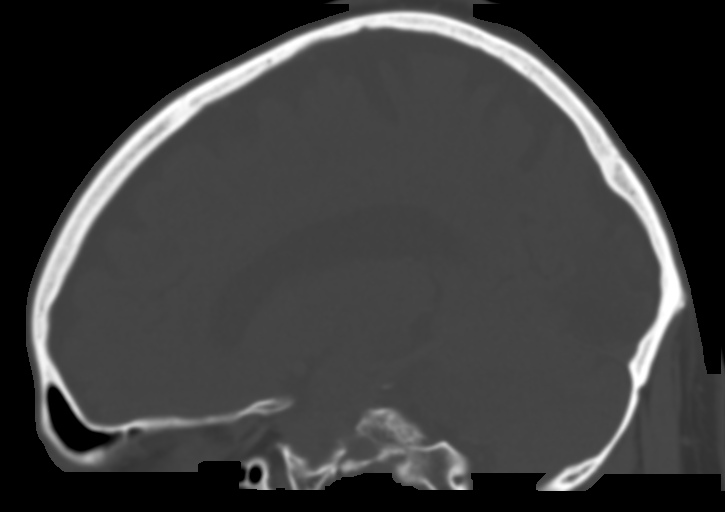
[im 27/54  soft-tissue]
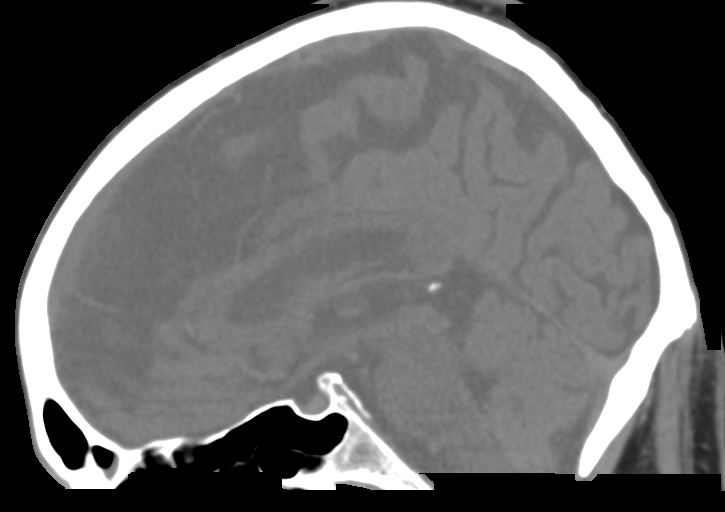
[im 27/54  bone]
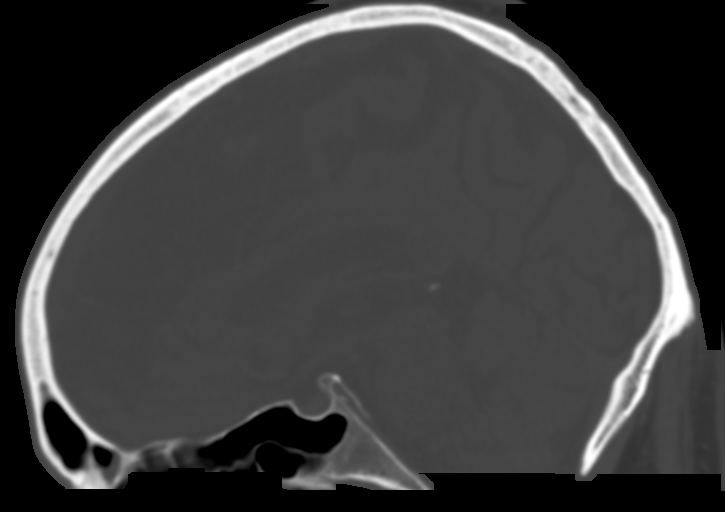
[im 31/54  bone]
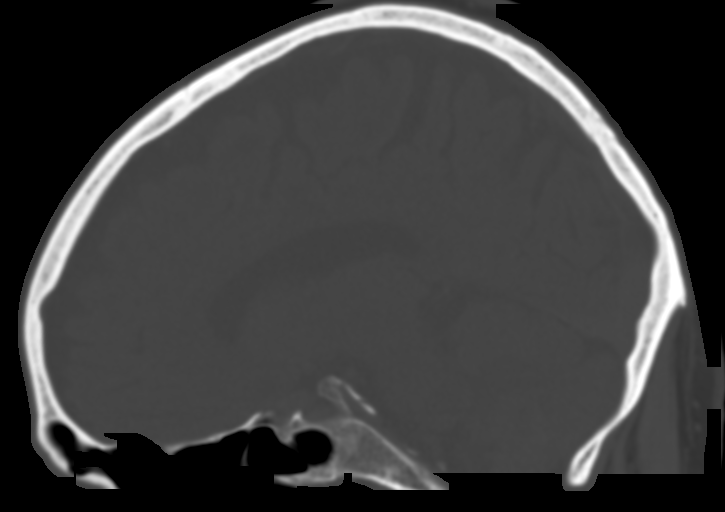
[im 36/54  bone]
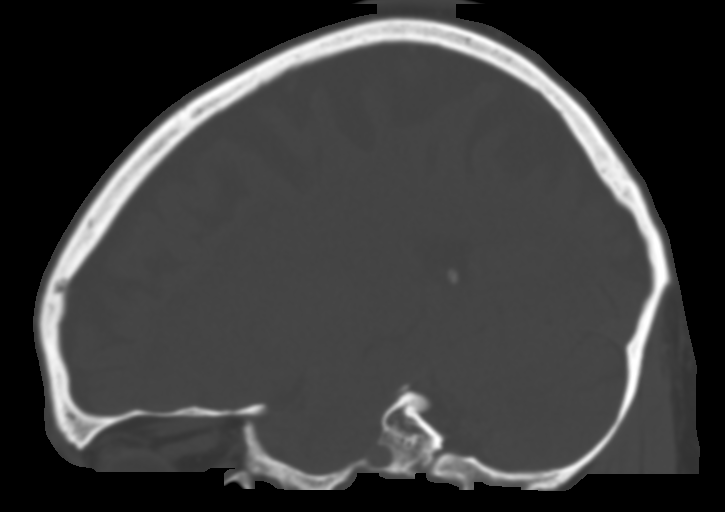

[14 of 33 positions shown; findings below may reference images not displayed]

FINDINGS: Brain: No evidence of acute infarction, hemorrhage, hydrocephalus,
extra-axial collection or mass lesion/mass effect. There is no
evidence of mass within the olfactory recesses which are
symmetrically low-density. Moderate remote infarcts in the right
occipital and lateral frontal lobes. Chronic small vessel ischemic
type change in the cerebral white matter with patchy appearance. No
inferior frontal gliosis to suggest remote contusion. Prominent
frontal parietal atrophy at the vertex.

Vascular: Atherosclerotic calcification.

Skull: No acute or destructive finding.

Sinuses/Orbits: Minimal coverage of the nasal cavity which is clear
superiorly.
IMPRESSION: 1. No specific explanation for symptoms.
2. Moderate remote infarcts in the right occipital and right frontal
lobes. Chronic small vessel ischemia.
3. Age advanced cortical atrophy most notable at the frontal
parietal vertex.

## 2020-06-03 ENCOUNTER — Telehealth: Payer: Self-pay | Admitting: Family Medicine

## 2020-06-03 NOTE — Telephone Encounter (Signed)
Left message asking patient to call office please change awv appointment to virtual Carollee Herter will be out of office

## 2020-06-06 NOTE — Progress Notes (Signed)
Erroneous Encounter

## 2020-06-25 DIAGNOSIS — R8281 Pyuria: Secondary | ICD-10-CM | POA: Diagnosis not present

## 2020-06-25 DIAGNOSIS — R69 Illness, unspecified: Secondary | ICD-10-CM | POA: Diagnosis not present

## 2020-07-04 DIAGNOSIS — H2511 Age-related nuclear cataract, right eye: Secondary | ICD-10-CM | POA: Diagnosis not present

## 2020-07-08 ENCOUNTER — Encounter: Payer: Self-pay | Admitting: Gastroenterology

## 2020-07-09 DIAGNOSIS — I10 Essential (primary) hypertension: Secondary | ICD-10-CM | POA: Diagnosis not present

## 2020-07-09 DIAGNOSIS — H40033 Anatomical narrow angle, bilateral: Secondary | ICD-10-CM | POA: Diagnosis not present

## 2020-07-09 DIAGNOSIS — R69 Illness, unspecified: Secondary | ICD-10-CM | POA: Diagnosis not present

## 2020-07-09 DIAGNOSIS — Z9889 Other specified postprocedural states: Secondary | ICD-10-CM | POA: Diagnosis not present

## 2020-07-09 DIAGNOSIS — Z79899 Other long term (current) drug therapy: Secondary | ICD-10-CM | POA: Diagnosis not present

## 2020-07-09 DIAGNOSIS — I2510 Atherosclerotic heart disease of native coronary artery without angina pectoris: Secondary | ICD-10-CM | POA: Diagnosis not present

## 2020-07-09 DIAGNOSIS — H2513 Age-related nuclear cataract, bilateral: Secondary | ICD-10-CM | POA: Diagnosis not present

## 2020-07-09 DIAGNOSIS — E785 Hyperlipidemia, unspecified: Secondary | ICD-10-CM | POA: Diagnosis not present

## 2020-07-09 DIAGNOSIS — H2511 Age-related nuclear cataract, right eye: Secondary | ICD-10-CM | POA: Diagnosis not present

## 2020-07-16 DIAGNOSIS — H2512 Age-related nuclear cataract, left eye: Secondary | ICD-10-CM | POA: Diagnosis not present

## 2020-07-19 ENCOUNTER — Telehealth: Payer: Self-pay | Admitting: Family Medicine

## 2020-07-19 NOTE — Telephone Encounter (Signed)
Left message for patient to call back and schedule Medicare Annual Wellness Visit (AWV) either virtually or in office  No detailed message left .   AWVI  please schedule at anytime with [facility] Nurse Health Advisor 1 or 2   This should be a 45 minute visit. Patient also needs appointment with pcp last appointment 02/06/2019

## 2020-08-02 ENCOUNTER — Other Ambulatory Visit: Payer: Self-pay | Admitting: Family Medicine

## 2020-08-14 ENCOUNTER — Ambulatory Visit (INDEPENDENT_AMBULATORY_CARE_PROVIDER_SITE_OTHER): Payer: Medicare HMO

## 2020-08-14 ENCOUNTER — Other Ambulatory Visit: Payer: Self-pay

## 2020-08-14 DIAGNOSIS — Z0000 Encounter for general adult medical examination without abnormal findings: Secondary | ICD-10-CM | POA: Diagnosis not present

## 2020-08-14 DIAGNOSIS — Z1211 Encounter for screening for malignant neoplasm of colon: Secondary | ICD-10-CM

## 2020-08-14 NOTE — Progress Notes (Signed)
Virtual Visit via Telephone Note  I connected with  Blair Heys on 08/14/20 at  8:00 AM EDT by telephone and verified that I am speaking with the correct person using two identifiers.  Medicare Annual Wellness visit completed telephonically due to Covid-19 pandemic.   Persons participating in this call: This Health Coach and this patient.   Location: Patient: Home Provider: Office   I discussed the limitations, risks, security and privacy concerns of performing an evaluation and management service by telephone and the availability of in person appointments. The patient expressed understanding and agreed to proceed.  Unable to perform video visit due to video visit attempted and failed and/or patient does not have video capability.   Some vital signs may be absent or patient reported.   Eugene Schlein, LPN    Subjective:   Eugene Daniel is a 70 y.o. male who presents for an Initial Medicare Annual Wellness Visit.  Review of Systems     Cardiac Risk Factors include: advanced age (>63men, >42 women);hypertension;dyslipidemia;male gender;smoking/ tobacco exposure     Objective:    There were no vitals filed for this visit. There is no height or weight on file to calculate BMI.  Advanced Directives 08/14/2020  Does Patient Have a Medical Advance Directive? Yes  Type of Estate agent of [place];Living will  Copy of Healthcare Power of Attorney in Chart? No - copy requested    Current Medications (verified) Outpatient Encounter Medications as of 08/14/2020  Medication Sig  . amLODipine (NORVASC) 10 MG tablet TAKE 1 TABLET BY MOUTH EVERY DAY  . aspirin 81 MG EC tablet Take by mouth.  Marland Kitchen atorvastatin (LIPITOR) 40 MG tablet TAKE 1 TABLET BY MOUTH DAILY AT 6 PM.  . Bromfenac Sodium (PROLENSA) 0.07 % SOLN Place 1 drop into the left eye daily. BEGIN 3 DAYS PRIOR TO SX  . losartan (COZAAR) 100 MG tablet Take 1 tablet (100 mg total) by mouth daily.  .  metoprolol succinate (TOPROL-XL) 50 MG 24 hr tablet TAKE 1 TABLET BY MOUTH DAILY. TAKE WITH OR IMMEDIATELY FOLLOWING A MEAL.  Marland Kitchen PROLENSA 0.07 % SOLN PLEASE SEE ATTACHED FOR DETAILED DIRECTIONS  . valsartan (DIOVAN) 320 MG tablet Take by mouth.   No facility-administered encounter medications on file as of 08/14/2020.    Allergies (verified) Patient has no known allergies.   History: Past Medical History:  Diagnosis Date  . CORONARY ARTERY DISEASE    Cath 2005. LAD 75% stenosis in a small vessel. EF 45-50%.  Marland Kitchen ELEVATED PROSTATE SPECIFIC ANTIGEN 01/01/2009  . HYPERLIPIDEMIA 01/01/2009  . Kidney stone    Past Surgical History:  Procedure Laterality Date  . EYE SURGERY     cataracts  . TONSILLECTOMY     Family History  Problem Relation Age of Onset  . Heart disease Paternal Grandfather   . Leukemia Paternal Grandfather    Social History   Socioeconomic History  . Marital status: Married    Spouse name: Not on file  . Number of children: 4  . Years of education: Not on file  . Highest education level: Not on file  Occupational History  . Occupation: retired  Tobacco Use  . Smoking status: Light Tobacco Smoker    Packs/day: 0.25    Types: Cigarettes    Last attempt to quit: 04/06/2014    Years since quitting: 6.3  . Smokeless tobacco: Never Used  . Tobacco comment: 2 cigs per day.    Vaping Use  .  Vaping Use: Never used  Substance and Sexual Activity  . Alcohol use: Yes    Alcohol/week: 0.0 standard drinks  . Drug use: No  . Sexual activity: Not on file  Other Topics Concern  . Not on file  Social History Narrative  . Not on file   Social Determinants of Health   Financial Resource Strain: Low Risk   . Difficulty of Paying Living Expenses: Not hard at all  Food Insecurity: No Food Insecurity  . Worried About Programme researcher, broadcasting/film/video in the Last Year: Never true  . Ran Out of Food in the Last Year: Never true  Transportation Needs: No Transportation Needs  . Lack  of Transportation (Medical): No  . Lack of Transportation (Non-Medical): No  Physical Activity: Inactive  . Days of Exercise per Week: 0 days  . Minutes of Exercise per Session: 0 min  Stress: No Stress Concern Present  . Feeling of Stress : Not at all  Social Connections: Moderately Integrated  . Frequency of Communication with Friends and Family: More than three times a week  . Frequency of Social Gatherings with Friends and Family: Once a week  . Attends Religious Services: More than 4 times per year  . Active Member of Clubs or Organizations: No  . Attends Banker Meetings: Never  . Marital Status: Married    Tobacco Counseling Ready to quit: Not Answered Counseling given: Not Answered Comment: 2 cigs per day.     Clinical Intake:  Pre-visit preparation completed: Yes  Pain : No/denies pain     BMI - recorded: 28.2 Nutritional Status: BMI 25 -29 Overweight Nutritional Risks: None Diabetes: No  How often do you need to have someone help you when you read instructions, pamphlets, or other written materials from your doctor or pharmacy?: 1 - Never  Diabetic?No  Interpreter Needed?: No  Information entered by :: Lanier Ensign, LPN   Activities of Daily Living In your present state of health, do you have any difficulty performing the following activities: 08/14/2020  Hearing? N  Vision? N  Difficulty concentrating or making decisions? Y  Comment focus factor  Walking or climbing stairs? N  Dressing or bathing? N  Doing errands, shopping? N  Preparing Food and eating ? N  Using the Toilet? N  In the past six months, have you accidently leaked urine? N  Do you have problems with loss of bowel control? N  Managing your Medications? N  Managing your Finances? N  Housekeeping or managing your Housekeeping? N  Some recent data might be hidden    Patient Care Team: Kristian Covey, MD as PCP - General  Indicate any recent Medical Services you  may have received from other than [facility] providers in the past year (date may be approximate).     Assessment:   This is a routine wellness examination for Eugene Daniel.  Hearing/Vision screen  Hearing Screening   125Hz  250Hz  500Hz  1000Hz  2000Hz  3000Hz  4000Hz  6000Hz  8000Hz   Right ear:           Left ear:           Comments: Pt denies any hearing   Vision Screening Comments: [facility]   Dietary issues and exercise activities discussed: Current Exercise Habits: Home exercise routine, Type of exercise: walking (yard work), Time (Minutes): 60, Frequency (Times/Week): 5, Weekly Exercise (Minutes/Week): 300  Goals    . Patient Stated     Walk  over 2 miles a day       Depression Screen PHQ 2/9 Scores 08/14/2020 01/03/2018 04/09/2015  PHQ - 2 Score 0 4 0    Fall Risk Fall Risk  08/14/2020 01/03/2018  Falls in the past year? 0 No  Number falls in past yr: 0 -  Injury with Fall? 0 -  Follow up Falls prevention discussed -    FALL RISK PREVENTION PERTAINING TO THE HOME:  Any stairs in or around the home? Yes  If so, are there any without handrails? No  Home free of loose throw rugs in walkways, pet beds, electrical cords, etc? Yes  Adequate lighting in your home to reduce risk of falls? Yes   ASSISTIVE DEVICES UTILIZED TO PREVENT FALLS:  Life alert? No  Use of a cane, walker or w/c? No  Grab bars in the bathroom? Yes  Shower chair or bench in shower? No  Elevated toilet seat or a handicapped toilet? No   TIMED UP AND GO:  Was the test performed? No     Cognitive Function:     6CIT Screen 08/14/2020  What Year? 0 points  What month? 0 points  Count back from 20 2 points  Months in reverse 0 points  Repeat phrase 0 points    Immunizations Immunization History  Administered Date(s) Administered  . Fluad Quad(high Dose 65+) 02/06/2019  . Influenza,inj,Quad PF,6+ Mos 04/06/2014, 03/27/2015, 04/09/2015, 03/04/2016  . PFIZER(Purple  Top)SARS-COV-2 Vaccination 09/14/2019  . Pneumococcal Conjugate-13 01/03/2018  . Pneumococcal Polysaccharide-23 01/02/2010, 02/06/2019  . Td 01/02/2010  . Tdap 12/26/2019    TDAP status: Up to date  Flu Vaccine status: Due, Education has been provided regarding the importance of this vaccine. Advised may receive this vaccine at local pharmacy or Health Dept. Aware to provide a copy of the vaccination record if obtained from local pharmacy or Health Dept. Verbalized acceptance and understanding.  Pneumococcal vaccine status: Up to date  Covid-19 vaccine status: Declined, Education has been provided regarding the importance of this vaccine but patient still declined. Advised may receive this vaccine at local pharmacy or Health Dept.or vaccine clinic. Aware to provide a copy of the vaccination record if obtained from local pharmacy or Health Dept. Verbalized acceptance and understanding.  Qualifies for Shingles Vaccine? Yes   Zostavax completed No   Shingrix Completed?: No.    Education has been provided regarding the importance of this vaccine. Patient has been advised to call insurance company to determine out of pocket expense if they have not yet received this vaccine. Advised may also receive vaccine at local pharmacy or Health Dept. Verbalized acceptance and understanding.  Screening Tests Health Maintenance  Topic Date Due  . INFLUENZA VACCINE  12/24/2019  . COLONOSCOPY (Pts 45-63yrs Insurance coverage will need to be confirmed)  03/17/2020  . COVID-19 Vaccine (2 - Pfizer 3-dose series) 08/30/2020 (Originally 10/05/2019)  . TETANUS/TDAP  12/25/2029  . Hepatitis C Screening  Completed  . PNA vac Low Risk Adult  Completed  . HPV VACCINES  Aged Out    Health Maintenance  Health Maintenance Due  Topic Date Due  . INFLUENZA VACCINE  12/24/2019  . COLONOSCOPY (Pts 45-53yrs Insurance coverage will need to be confirmed)  03/17/2020    Colorectal cancer screening: Referral to GI  placed 08/14/20. Pt aware the office will call re: appt.    Additional Screening:  Hepatitis C Screening:  Completed 02/08/18  Vision Screening: Recommended annual ophthalmology exams for early detection of glaucoma and other  disorders of the eye. Is the patient up to date with their annual eye exam?  Yes  Who is the provider or what is the name of the office in which the patient attends annual eye exams? [facility] If pt is not established with a provider, would they like to be referred to a provider to establish care? No .   Dental Screening: Recommended annual dental exams for proper oral hygiene  Community Resource Referral / Chronic Care Management: CRR required this visit?  No   CCM required this visit?  No      Plan:     I have personally reviewed and noted the following in the patient's chart:   . Medical and social history . Use of alcohol, tobacco or illicit drugs  . Current medications and supplements . Functional ability and status . Nutritional status . Physical activity . Advanced directives . List of other physicians . Hospitalizations, surgeries, and ER visits in previous 12 months . Vitals . Screenings to include cognitive, depression, and falls . Referrals and appointments  In addition, I have reviewed and discussed with patient certain preventive protocols, quality metrics, and best practice recommendations. A written personalized care plan for preventive services as well as general preventive health recommendations were provided to patient.     Eugene Schleinina H Jaren Kearn, LPN   [id number]/95623/23/2022   Nurse Notes: [place] possible for colonoscopy.

## 2020-08-14 NOTE — Patient Instructions (Addendum)
Eugene Daniel , Thank you for taking time to come for your Medicare Wellness Visit. I appreciate your ongoing commitment to your health goals. Please review the following plan we discussed and let me know if I can assist you in the future.   Screening recommendations/referrals: Colonoscopy: Ordered placed 08/14/20 Recommended yearly ophthalmology/optometry visit for glaucoma screening and checkup Recommended yearly dental visit for hygiene and checkup  Vaccinations: Influenza vaccine: Due and discussed Pneumococcal vaccine: Up to date Tdap vaccine: Up to date Shingles vaccine: Shingrix ,Please contact your pharmacy for coverage information.    Covid-19: 1 dose 09/14/19,further doses declined and discussed  Advanced directives: Please bring a copy of your health care power of attorney and living will to the office at your convenience.  Conditions/risks identified: Walk for 2 miles a day  Next appointment: Follow up in one year for your annual wellness visit.  Preventive Care 42 Years and Older, Male Preventive care refers to lifestyle choices and visits with your health care provider that can promote health and wellness. What does preventive care include?  A yearly physical exam. This is also called an annual well check.  Dental exams once or twice a year.  Routine eye exams. Ask your health care provider how often you should have your eyes checked.  Personal lifestyle choices, including:  Daily care of your teeth and gums.  Regular physical activity.  Eating a healthy diet.  Avoiding tobacco and drug use.  Limiting alcohol use.  Practicing safe sex.  Taking low doses of aspirin every day.  Taking vitamin and mineral supplements as recommended by your health care provider. What happens during an annual well check? The services and screenings done by your health care provider during your annual well check will depend on your age, overall health, lifestyle risk factors, and  family history of disease. Counseling  Your health care provider may ask you questions about your:  Alcohol use.  Tobacco use.  Drug use.  Emotional well-being.  Home and relationship well-being.  Sexual activity.  Eating habits.  History of falls.  Memory and ability to understand (cognition).  Work and work Astronomer. Screening  You may have the following tests or measurements:  Height, weight, and BMI.  Blood pressure.  Lipid and cholesterol levels. These may be checked every 5 years, or more frequently if you are over 55 years old.  Skin check.  Lung cancer screening. You may have this screening every year starting at age 90 if you have a 30-pack-year history of smoking and currently smoke or have quit within the past 15 years.  Fecal occult blood test (FOBT) of the stool. You may have this test every year starting at age 77.  Flexible sigmoidoscopy or colonoscopy. You may have a sigmoidoscopy every 5 years or a colonoscopy every 10 years starting at age 12.  Prostate cancer screening. Recommendations will vary depending on your family history and other risks.  Hepatitis C blood test.  Hepatitis B blood test.  Sexually transmitted disease (STD) testing.  Diabetes screening. This is done by checking your blood sugar (glucose) after you have not eaten for a while (fasting). You may have this done every 1-3 years.  Abdominal aortic aneurysm (AAA) screening. You may need this if you are a current or former smoker.  Osteoporosis. You may be screened starting at age 48 if you are at high risk. Talk with your health care provider about your test results, treatment options, and if necessary, the need for more  tests. Vaccines  Your health care provider may recommend certain vaccines, such as:  Influenza vaccine. This is recommended every year.  Tetanus, diphtheria, and acellular pertussis (Tdap, Td) vaccine. You may need a Td booster every 10 years.  Zoster  vaccine. You may need this after age 82.  Pneumococcal 13-valent conjugate (PCV13) vaccine. One dose is recommended after age 26.  Pneumococcal polysaccharide (PPSV23) vaccine. One dose is recommended after age 35. Talk to your health care provider about which screenings and vaccines you need and how often you need them. This information is not intended to replace advice given to you by your health care provider. Make sure you discuss any questions you have with your health care provider. Document Released: 06/07/2015 Document Revised: 01/29/2016 Document Reviewed: 03/12/2015 Elsevier Interactive Patient Education  2017 [place] Prevention in the Home Falls can cause injuries. They can happen to people of all ages. There are many things you can do to make your home safe and to help prevent falls. What can I do on the outside of my home?  Regularly fix the edges of walkways and driveways and fix any cracks.  Remove anything that might make you trip as you walk through a door, such as a raised step or threshold.  Trim any bushes or trees on the path to your home.  Use bright outdoor lighting.  Clear any walking paths of anything that might make someone trip, such as rocks or tools.  Regularly check to see if handrails are loose or broken. Make sure that both sides of any steps have handrails.  Any raised decks and porches should have guardrails on the edges.  Have any leaves, snow, or ice cleared regularly.  Use sand or salt on walking paths during winter.  Clean up any spills in your garage right away. This includes oil or grease spills. What can I do in the bathroom?  Use night lights.  Install grab bars by the toilet and in the tub and shower. Do not use towel bars as grab bars.  Use non-skid mats or decals in the tub or shower.  If you need to sit down in the shower, use a plastic, non-slip stool.  Keep the floor dry. Clean up any water that spills on the  floor as soon as it happens.  Remove soap buildup in the tub or shower regularly.  Attach bath mats securely with double-sided non-slip rug tape.  Do not have throw rugs and other things on the floor that can make you trip. What can I do in the bedroom?  Use night lights.  Make sure that you have a light by your bed that is easy to reach.  Do not use any sheets or blankets that are too big for your bed. They should not hang down onto the floor.  Have a firm chair that has side arms. You can use this for support while you get dressed.  Do not have throw rugs and other things on the floor that can make you trip. What can I do in the kitchen?  Clean up any spills right away.  Avoid walking on wet floors.  Keep items that you use a lot in easy-to-reach places.  If you need to reach something above you, use a strong step stool that has a grab bar.  Keep electrical cords out of the way.  Do not use floor polish or wax that makes floors slippery. If you must use wax, use non-skid floor  wax.  Do not have throw rugs and other things on the floor that can make you trip. What can I do with my stairs?  Do not leave any items on the stairs.  Make sure that there are handrails on both sides of the stairs and use them. Fix handrails that are broken or loose. Make sure that handrails are as long as the stairways.  Check any carpeting to make sure that it is firmly attached to the stairs. Fix any carpet that is loose or worn.  Avoid having throw rugs at the top or bottom of the stairs. If you do have throw rugs, attach them to the floor with carpet tape.  Make sure that you have a light switch at the top of the stairs and the bottom of the stairs. If you do not have them, ask someone to add them for you. What else can I do to help prevent falls?  Wear shoes that:  Do not have high heels.  Have rubber bottoms.  Are comfortable and fit you well.  Are closed at the toe. Do not wear  sandals.  If you use a stepladder:  Make sure that it is fully opened. Do not climb a closed stepladder.  Make sure that both sides of the stepladder are locked into place.  Ask someone to hold it for you, if possible.  Clearly mark and make sure that you can see:  Any grab bars or handrails.  First and last steps.  Where the edge of each step is.  Use tools that help you move around (mobility aids) if they are needed. These include:  Canes.  Walkers.  Scooters.  Crutches.  Turn on the lights when you go into a dark area. Replace any light bulbs as soon as they burn out.  Set up your furniture so you have a clear path. Avoid moving your furniture around.  If any of your floors are uneven, fix them.  If there are any pets around you, be aware of where they are.  Review your medicines with your doctor. Some medicines can make you feel dizzy. This can increase your chance of falling. Ask your doctor what other things that you can do to help prevent falls. This information is not intended to replace advice given to you by your health care provider. Make sure you discuss any questions you have with your health care provider. Document Released: 03/07/2009 Document Revised: 10/17/2015 Document Reviewed: 06/15/2014 Elsevier Interactive Patient Education  2017 [employer].

## 2020-08-19 DIAGNOSIS — I10 Essential (primary) hypertension: Secondary | ICD-10-CM | POA: Diagnosis not present

## 2020-08-19 DIAGNOSIS — R69 Illness, unspecified: Secondary | ICD-10-CM | POA: Diagnosis not present

## 2020-08-19 DIAGNOSIS — H2512 Age-related nuclear cataract, left eye: Secondary | ICD-10-CM | POA: Diagnosis not present

## 2020-08-19 DIAGNOSIS — I2510 Atherosclerotic heart disease of native coronary artery without angina pectoris: Secondary | ICD-10-CM | POA: Diagnosis not present

## 2020-08-19 DIAGNOSIS — E785 Hyperlipidemia, unspecified: Secondary | ICD-10-CM | POA: Diagnosis not present

## 2020-11-02 ENCOUNTER — Other Ambulatory Visit: Payer: Self-pay | Admitting: Family Medicine

## 2020-11-03 ENCOUNTER — Other Ambulatory Visit: Payer: Self-pay | Admitting: Family Medicine

## 2020-11-12 DIAGNOSIS — Z825 Family history of asthma and other chronic lower respiratory diseases: Secondary | ICD-10-CM | POA: Diagnosis not present

## 2020-11-12 DIAGNOSIS — E785 Hyperlipidemia, unspecified: Secondary | ICD-10-CM | POA: Diagnosis not present

## 2020-11-12 DIAGNOSIS — R69 Illness, unspecified: Secondary | ICD-10-CM | POA: Diagnosis not present

## 2020-11-12 DIAGNOSIS — I10 Essential (primary) hypertension: Secondary | ICD-10-CM | POA: Diagnosis not present

## 2020-11-12 DIAGNOSIS — I2510 Atherosclerotic heart disease of native coronary artery without angina pectoris: Secondary | ICD-10-CM | POA: Diagnosis not present

## 2020-11-12 DIAGNOSIS — Z833 Family history of diabetes mellitus: Secondary | ICD-10-CM | POA: Diagnosis not present

## 2020-11-12 DIAGNOSIS — Z7982 Long term (current) use of aspirin: Secondary | ICD-10-CM | POA: Diagnosis not present

## 2020-11-12 DIAGNOSIS — Z8673 Personal history of transient ischemic attack (TIA), and cerebral infarction without residual deficits: Secondary | ICD-10-CM | POA: Diagnosis not present

## 2020-12-01 ENCOUNTER — Other Ambulatory Visit: Payer: Self-pay | Admitting: Family Medicine

## 2021-01-09 ENCOUNTER — Encounter: Payer: Self-pay | Admitting: Gastroenterology

## 2021-01-29 ENCOUNTER — Other Ambulatory Visit: Payer: Self-pay

## 2021-01-29 ENCOUNTER — Ambulatory Visit (AMBULATORY_SURGERY_CENTER): Payer: Self-pay

## 2021-01-29 VITALS — Ht 74.0 in | Wt 195.0 lb

## 2021-01-29 DIAGNOSIS — Z1211 Encounter for screening for malignant neoplasm of colon: Secondary | ICD-10-CM

## 2021-01-29 MED ORDER — PLENVU 140 G PO SOLR: 1.0000 | Freq: Once | ORAL | 0 refills | Status: AC

## 2021-01-29 NOTE — Progress Notes (Signed)
Denies allergies to eggs or soy products. Denies complication of anesthesia or sedation. Denies use of weight loss medication. Denies use of O2.   Emmi instructions given for colonoscopy.  

## 2021-02-03 ENCOUNTER — Encounter: Payer: Self-pay | Admitting: Gastroenterology

## 2021-02-06 ENCOUNTER — Other Ambulatory Visit: Payer: Self-pay | Admitting: Family Medicine

## 2021-02-07 ENCOUNTER — Other Ambulatory Visit: Payer: Self-pay | Admitting: Family Medicine

## 2021-02-10 ENCOUNTER — Other Ambulatory Visit: Payer: Self-pay | Admitting: Family Medicine

## 2021-02-10 ENCOUNTER — Telehealth: Payer: Self-pay | Admitting: Family Medicine

## 2021-02-10 MED ORDER — METOPROLOL SUCCINATE ER 50 MG PO TB24: 50.0000 mg | ORAL_TABLET | Freq: Every day | ORAL | 0 refills | Status: DC

## 2021-02-10 NOTE — Telephone Encounter (Signed)
Patient's spouse called on his behalf to order a refill for metoprolol succinate (TOPROL-XL) 50 MG 24 hr tablet   Good callback number is [phone]   Please send to   [id number] - [place], [place] - [address] AT [address]CROSS FROM [address] Phone:  [phone]  Fax:  [phone]      Please Advise

## 2021-02-10 NOTE — Telephone Encounter (Signed)
Rx sent in. Spoke with the patient, he is aware.  

## 2021-02-13 ENCOUNTER — Encounter: Payer: Medicare HMO | Admitting: Gastroenterology

## 2021-03-11 ENCOUNTER — Other Ambulatory Visit: Payer: Self-pay | Admitting: Family Medicine

## 2021-04-05 ENCOUNTER — Other Ambulatory Visit: Payer: Self-pay | Admitting: Family Medicine

## 2021-05-04 ENCOUNTER — Other Ambulatory Visit: Payer: Self-pay | Admitting: Family Medicine

## 2021-05-14 ENCOUNTER — Other Ambulatory Visit: Payer: Self-pay | Admitting: Family Medicine

## 2021-05-15 ENCOUNTER — Other Ambulatory Visit: Payer: Self-pay | Admitting: Family Medicine

## 2021-05-20 ENCOUNTER — Other Ambulatory Visit: Payer: Self-pay | Admitting: Family Medicine

## 2021-05-24 ENCOUNTER — Other Ambulatory Visit: Payer: Self-pay | Admitting: Family Medicine

## 2021-06-01 ENCOUNTER — Other Ambulatory Visit: Payer: Self-pay | Admitting: Family Medicine

## 2021-06-03 DIAGNOSIS — H5213 Myopia, bilateral: Secondary | ICD-10-CM | POA: Diagnosis not present

## 2021-06-14 IMAGING — US US ABDOMINAL AORTA SCREENING AAA
1 series · 14 of 25 positions shown · non-contrast
Comparison: None.

CLINICAL DATA: Male between 65-75 years of age with a smoking
history.

EXAM:
US ABDOMINAL AORTA MEDICARE SCREENING
TECHNIQUE: Ultrasound examination of the abdominal aorta was performed as a
screening evaluation for abdominal aortic aneurysm.

[Series 1: us abdominal aorta screening aaa · 0.31mm/px · 14 of 25 slices shown]
[im 1/25]
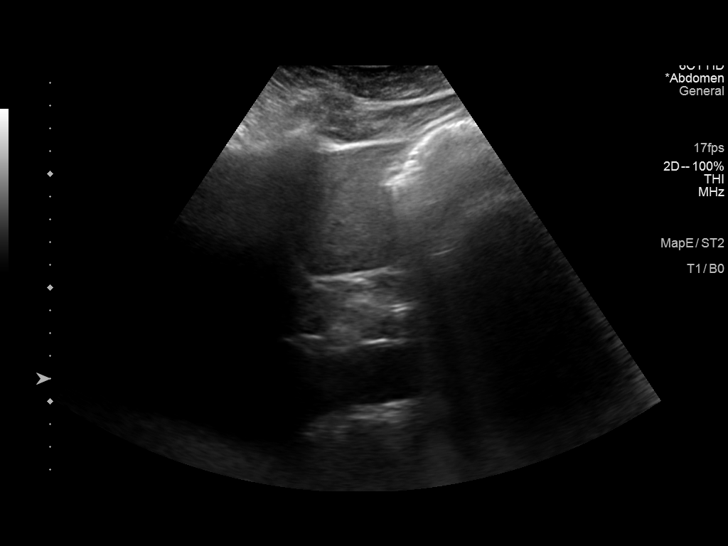
[im 3/25]
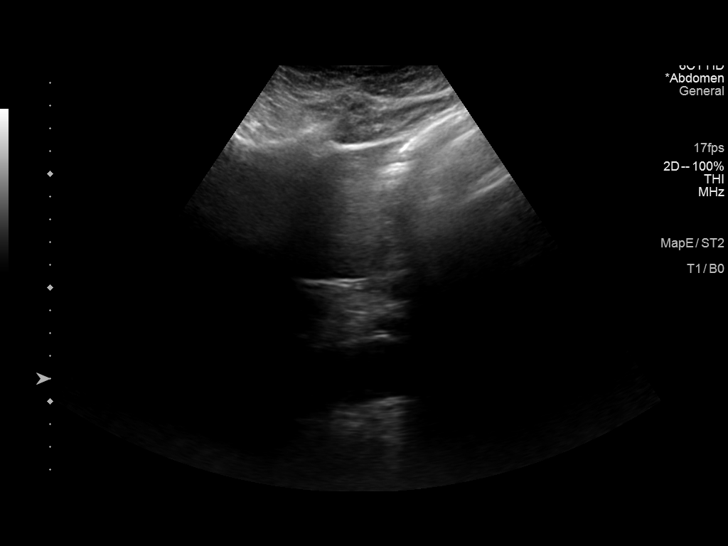
[im 5/25]
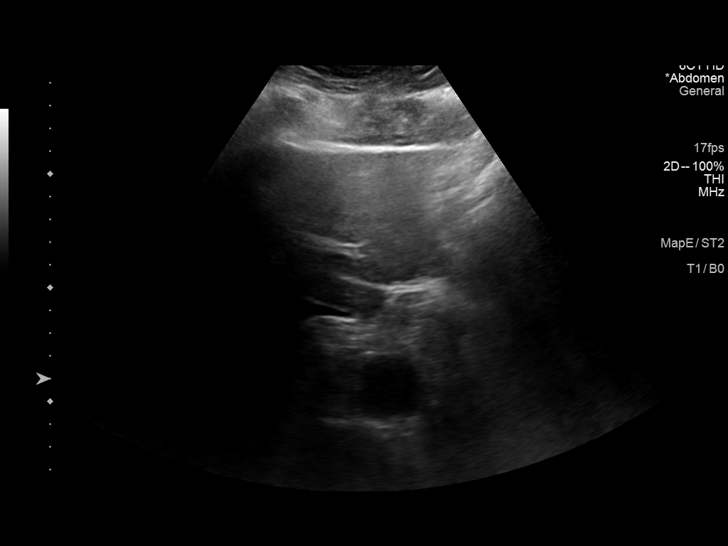
[im 7/25]
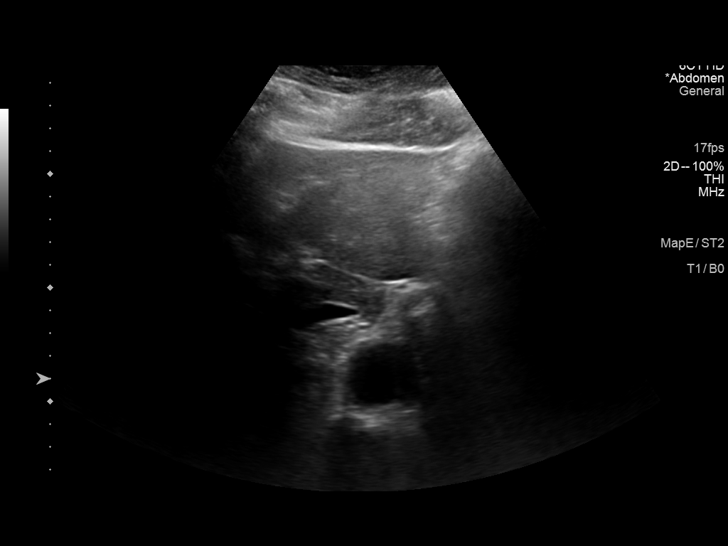
[im 9/25]
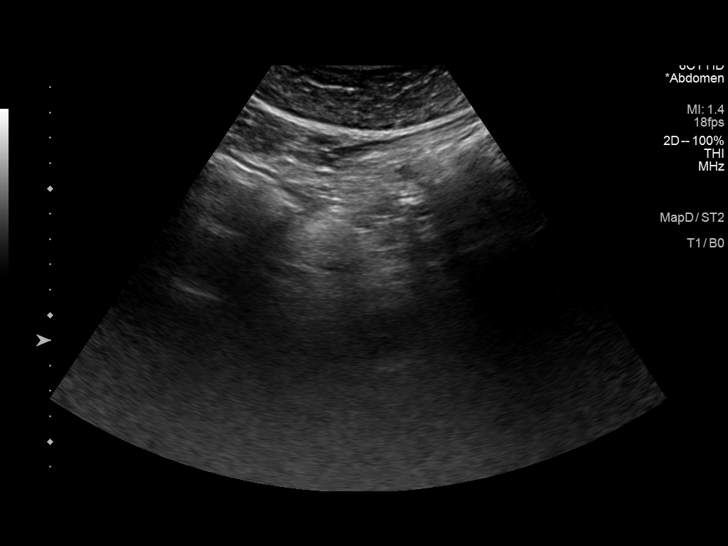
[im 10/25]
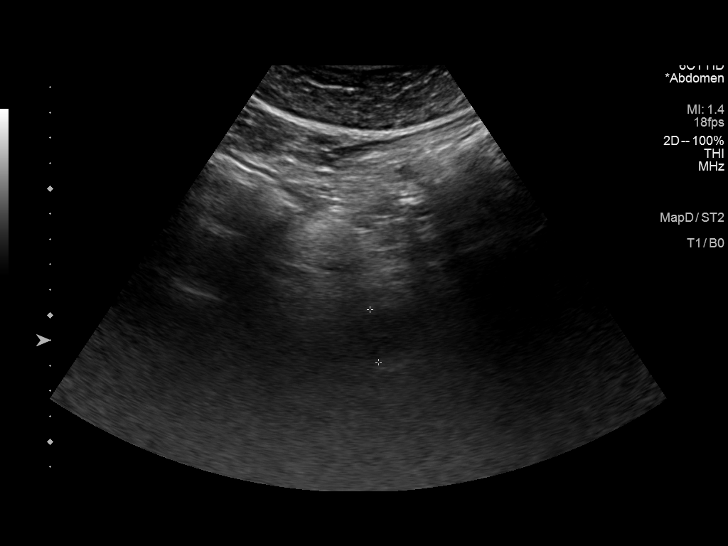
[im 12/25]
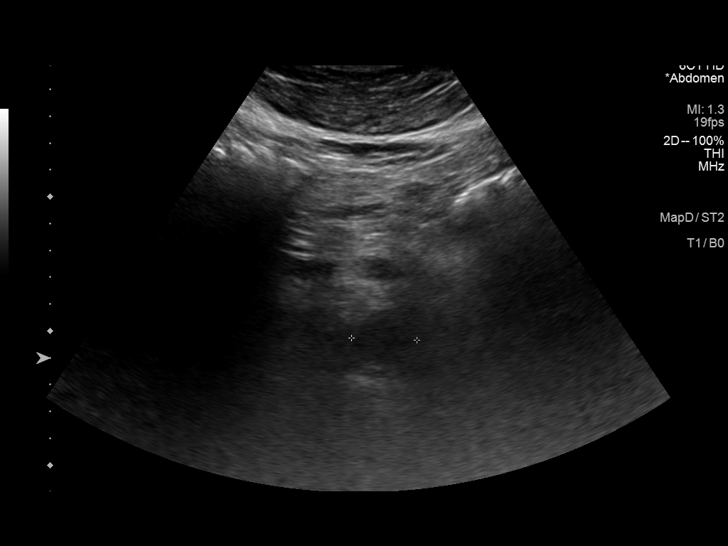
[im 14/25]
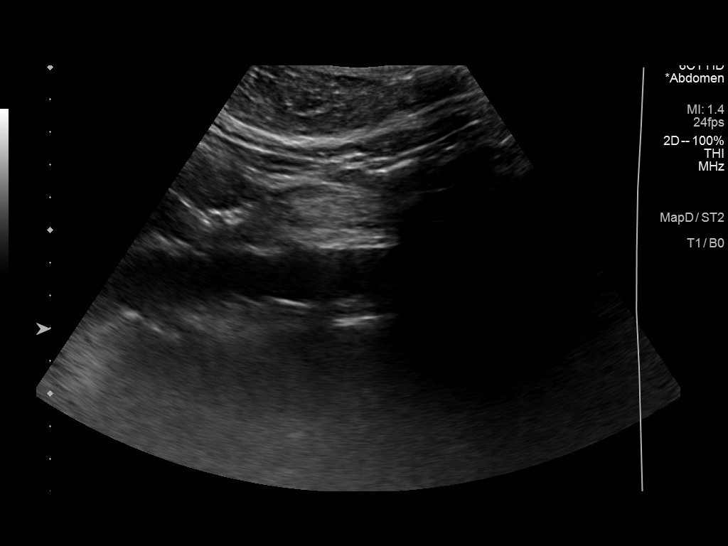
[im 16/25]
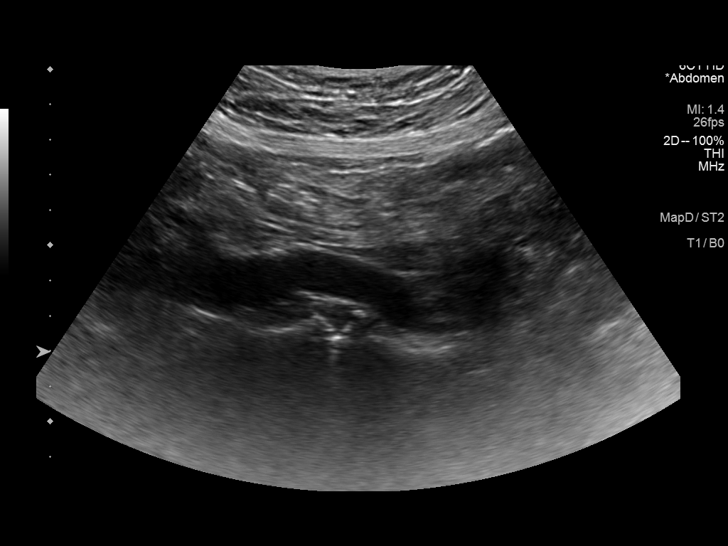
[im 17/25]
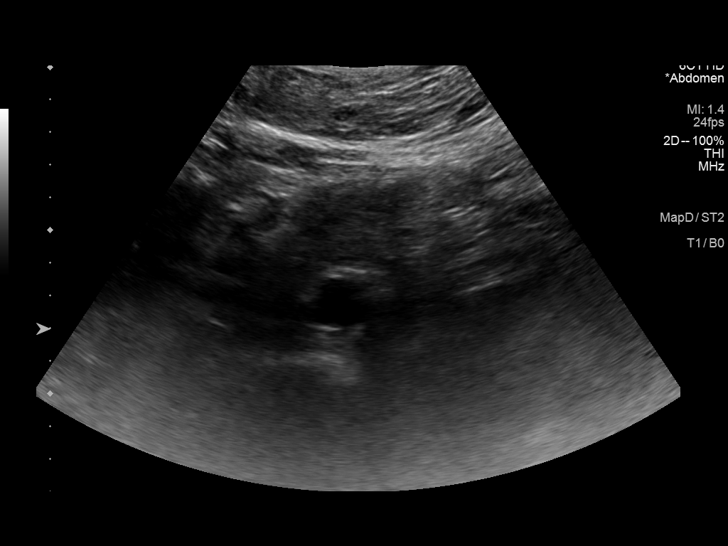
[im 19/25]
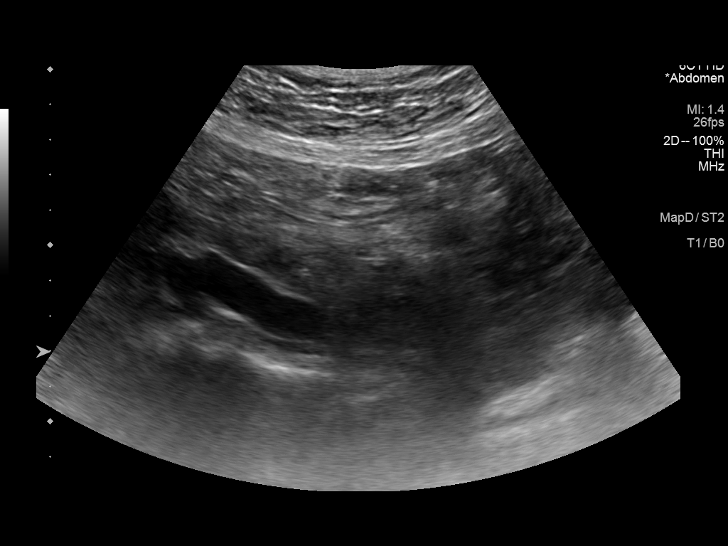
[im 21/25]
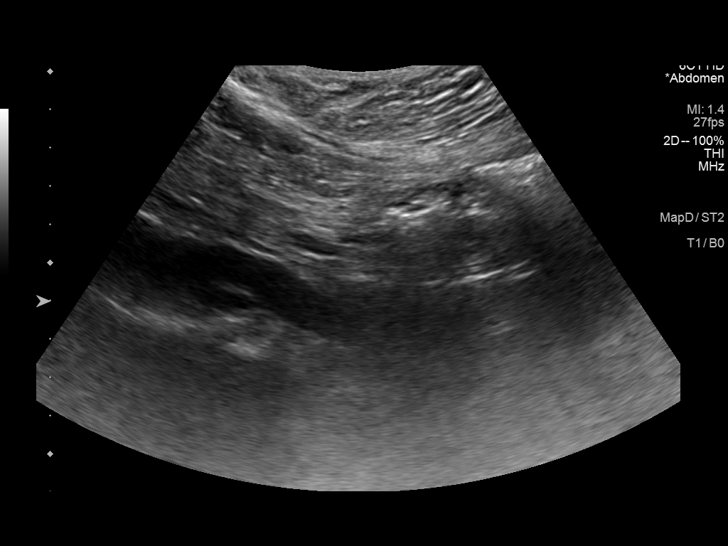
[im 23/25]
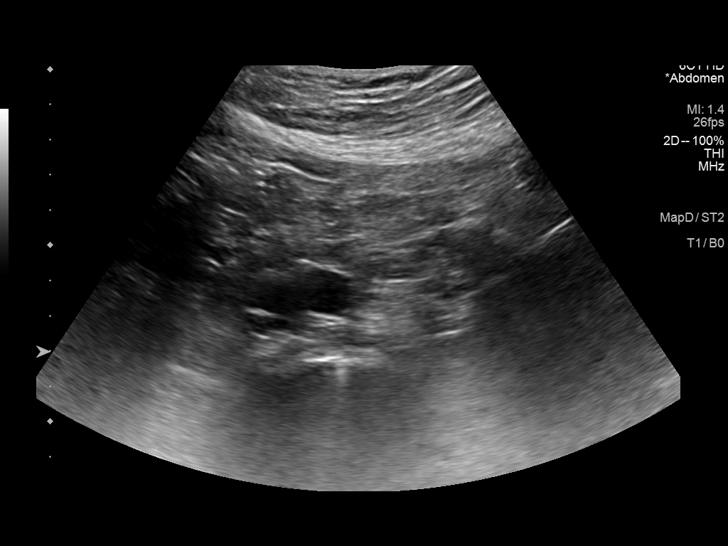
[im 25/25]
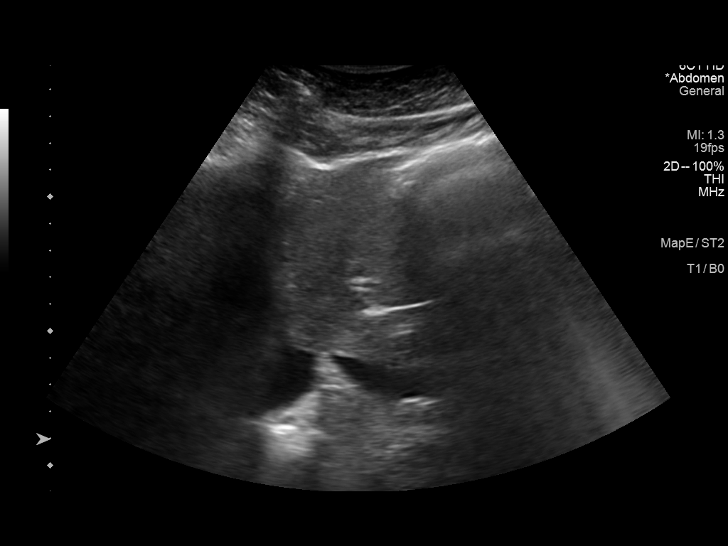

[14 of 25 positions shown; findings below may reference images not displayed]

FINDINGS: Abdominal aortic measurements as follows:

Proximal:  3.1 x 3.2 cm

Mid:  2.4 x 2.4 cm

Distal:  2.0 x 2.3 cm
Atherosclerotic plaque visualized in the abdominal aorta.

Right common iliac artery origin: 1.4 cm

Left common iliac artery origin: 1.4 cm
IMPRESSION: Atherosclerosis of the abdominal aorta with mildly prominent caliber
of the proximal abdominal aorta. Maximal aortic diameter is 3.2 cm.
Recommend followup by ultrasound in 3 years. This recommendation
follows ACR consensus guidelines: White Paper of the ACR Incidental

## 2021-06-26 ENCOUNTER — Other Ambulatory Visit: Payer: Self-pay | Admitting: Family Medicine

## 2021-07-01 DIAGNOSIS — F17210 Nicotine dependence, cigarettes, uncomplicated: Secondary | ICD-10-CM | POA: Diagnosis not present

## 2021-07-01 DIAGNOSIS — I10 Essential (primary) hypertension: Secondary | ICD-10-CM | POA: Diagnosis not present

## 2021-07-01 DIAGNOSIS — Z8673 Personal history of transient ischemic attack (TIA), and cerebral infarction without residual deficits: Secondary | ICD-10-CM | POA: Diagnosis not present

## 2021-07-01 DIAGNOSIS — Z809 Family history of malignant neoplasm, unspecified: Secondary | ICD-10-CM | POA: Diagnosis not present

## 2021-07-01 DIAGNOSIS — Z8249 Family history of ischemic heart disease and other diseases of the circulatory system: Secondary | ICD-10-CM | POA: Diagnosis not present

## 2021-07-01 DIAGNOSIS — M1990 Unspecified osteoarthritis, unspecified site: Secondary | ICD-10-CM | POA: Diagnosis not present

## 2021-07-01 DIAGNOSIS — Z7982 Long term (current) use of aspirin: Secondary | ICD-10-CM | POA: Diagnosis not present

## 2021-07-01 DIAGNOSIS — R69 Illness, unspecified: Secondary | ICD-10-CM | POA: Diagnosis not present

## 2021-07-01 DIAGNOSIS — Z008 Encounter for other general examination: Secondary | ICD-10-CM | POA: Diagnosis not present

## 2021-07-01 DIAGNOSIS — E785 Hyperlipidemia, unspecified: Secondary | ICD-10-CM | POA: Diagnosis not present

## 2021-07-21 DIAGNOSIS — H6122 Impacted cerumen, left ear: Secondary | ICD-10-CM | POA: Diagnosis not present

## 2021-07-27 ENCOUNTER — Other Ambulatory Visit: Payer: Self-pay | Admitting: Family Medicine

## 2021-07-29 DIAGNOSIS — H6981 Other specified disorders of Eustachian tube, right ear: Secondary | ICD-10-CM | POA: Diagnosis not present

## 2021-08-05 DIAGNOSIS — H60509 Unspecified acute noninfective otitis externa, unspecified ear: Secondary | ICD-10-CM | POA: Diagnosis not present

## 2021-08-05 DIAGNOSIS — H6123 Impacted cerumen, bilateral: Secondary | ICD-10-CM | POA: Diagnosis not present

## 2021-08-19 DIAGNOSIS — H9312 Tinnitus, left ear: Secondary | ICD-10-CM | POA: Diagnosis not present

## 2021-08-20 ENCOUNTER — Ambulatory Visit (INDEPENDENT_AMBULATORY_CARE_PROVIDER_SITE_OTHER): Payer: Medicare HMO

## 2021-08-20 ENCOUNTER — Ambulatory Visit: Payer: Medicare HMO

## 2021-08-20 VITALS — Ht 74.0 in | Wt 186.0 lb

## 2021-08-20 DIAGNOSIS — Z0000 Encounter for general adult medical examination without abnormal findings: Secondary | ICD-10-CM | POA: Diagnosis not present

## 2021-08-20 NOTE — Progress Notes (Signed)
? ?Subjective:  ? ARREN EDLUND is a 71 y.o. male who presents for Medicare Annual/Subsequent preventive examination. ? ?Review of Systems    ?Virtual Visit via Telephone Note ? ?I connected with  Star Age on 08/20/21 at  8:15 AM EDT by telephone and verified that I am speaking with the correct person using two identifiers. ? ?Location: ?Patient: Home ?Provider: Office ?Persons participating in the virtual visit: patient/Nurse Health Advisor ?  ?I discussed the limitations, risks, security and privacy concerns of performing an evaluation and management service by telephone and the availability of in person appointments. The patient expressed understanding and agreed to proceed. ? ?Interactive audio and video telecommunications were attempted between this nurse and patient, however failed, due to patient having technical difficulties OR patient did not have access to video capability.  We continued and completed visit with audio only. ? ?Some vital signs may be absent or patient reported.  ? ?Criselda Peaches, LPN  ?Cardiac Risk Factors include: advanced age (>79men, >77 women);hypertension;male gender ? ?   ?Objective:  ?  ?Today's Vitals  ? 08/20/21 0818  ?Weight: 186 lb (84.4 kg)  ?Height: 6\' 2"  (1.88 m)  ? ?Body mass index is 23.88 kg/m?. ? ? ?  08/20/2021  ?  8:28 AM 08/14/2020  ?  8:15 AM  ?Advanced Directives  ?Does Patient Have a Medical Advance Directive? Yes Yes  ?Type of Advance Directive Living will [place];Living will  ?Does patient want to make changes to medical advance directive? No - Patient declined   ?Copy of [place] in Chart?  No - copy requested  ? ? ?Current Medications (verified) ?Outpatient Encounter Medications as of 08/20/2021  ?Medication Sig  ? amLODipine (NORVASC) 10 MG tablet TAKE 1 TABLET BY MOUTH EVERY DAY  ? aspirin 81 MG EC tablet Take by mouth.  ? atorvastatin (LIPITOR) 40 MG tablet TAKE 1 TABLET BY MOUTH DAILY AT 6 PM.  ? Bromfenac  Sodium (PROLENSA) 0.07 % SOLN Place 1 drop into the left eye daily. BEGIN 3 DAYS PRIOR TO SX (Patient not taking: Reported on 01/29/2021)  ? losartan (COZAAR) 100 MG tablet TAKE 1 TABLET BY MOUTH EVERY DAY  ? metoprolol succinate (TOPROL-XL) 50 MG 24 hr tablet Take 1 tablet (50 mg total) by mouth daily. TAKE 1 TABLET BY MOUTH EVERY DAY WITH OR IMMEDIATELY FOLLOWING A MEAL. Needs an appointment for further refills.  ? Multiple Vitamin (MULTIVITAMIN) tablet Take 1 tablet by mouth daily.  ? PROLENSA 0.07 % SOLN PLEASE SEE ATTACHED FOR DETAILED DIRECTIONS (Patient not taking: Reported on 01/29/2021)  ? valsartan (DIOVAN) 320 MG tablet Take by mouth.  ? ?No facility-administered encounter medications on file as of 08/20/2021.  ? ? ?Allergies (verified) ?Patient has no known allergies.  ? ?History: ?Past Medical History:  ?Diagnosis Date  ? Cataract   ? CORONARY ARTERY DISEASE   ? Cath 2005. LAD 75% stenosis in a small vessel. EF 45-50%.  ? ELEVATED PROSTATE SPECIFIC ANTIGEN 01/01/2009  ? HYPERLIPIDEMIA 01/01/2009  ? Kidney stone   ? ?Past Surgical History:  ?Procedure Laterality Date  ? EYE SURGERY    ? cataracts  ? TONSILLECTOMY    ? ?Family History  ?Problem Relation Age of Onset  ? Colon cancer Father   ? Heart disease Paternal Grandfather   ? Leukemia Paternal Grandfather   ? Stomach cancer Neg Hx   ? Rectal cancer Neg Hx   ? ?Social History  ? ?Socioeconomic  History  ? Marital status: Married  ?  Spouse name: Not on file  ? Number of children: 4  ? Years of education: Not on file  ? Highest education level: Not on file  ?Occupational History  ? Occupation: retired  ?Tobacco Use  ? Smoking status: Light Smoker  ?  Packs/day: 0.25  ?  Types: Cigarettes  ?  Last attempt to quit: 04/06/2014  ?  Years since quitting: 7.3  ? Smokeless tobacco: Never  ? Tobacco comments:  ?  2 cigs per day.    ?Vaping Use  ? Vaping Use: Never used  ?Substance and Sexual Activity  ? Alcohol use: Yes  ?  Comment: occasional wine and beer  ? Drug  use: No  ? Sexual activity: Not on file  ?Other Topics Concern  ? Not on file  ?Social History Narrative  ? Not on file  ? ?Social Determinants of Health  ? ?Financial Resource Strain: Low Risk   ? Difficulty of Paying Living Expenses: Not hard at all  ?Food Insecurity: No Food Insecurity  ? Worried About Charity fundraiser in the Last Year: Never true  ? Ran Out of Food in the Last Year: Never true  ?Transportation Needs: No Transportation Needs  ? Lack of Transportation (Medical): No  ? Lack of Transportation (Non-Medical): No  ?Physical Activity: Inactive  ? Days of Exercise per Week: 0 days  ? Minutes of Exercise per Session: 0 min  ?Stress: No Stress Concern Present  ? Feeling of Stress : Not at all  ?Social Connections: Unknown  ? Frequency of Communication with Friends and Family: More than three times a week  ? Frequency of Social Gatherings with Friends and Family: More than three times a week  ? Attends Religious Services: More than 4 times per year  ? Active Member of Clubs or Organizations: Yes  ? Attends Archivist Meetings: More than 4 times per year  ? Marital Status: Not on file  ? ? ?Clinical Intake: ?How often do you need to have someone help you when you read instructions, pamphlets, or other written materials from your doctor or pharmacy?: 1 - Never ? ?Diabetic?  No ? ?Interpreter Needed?: No ?Activities of Daily Living ? ?  08/20/2021  ?  8:26 AM  ?In your present state of health, do you have any difficulty performing the following activities:  ?Hearing? 0  ?Vision? 0  ?Difficulty concentrating or making decisions? 0  ?Walking or climbing stairs? 0  ?Dressing or bathing? 0  ?Doing errands, shopping? 0  ?Preparing Food and eating ? N  ?Using the Toilet? N  ?In the past six months, have you accidently leaked urine? N  ?Do you have problems with loss of bowel control? N  ?Managing your Medications? N  ?Managing your Finances? N  ?Housekeeping or managing your Housekeeping? N  ? ? ?Patient  Care Team: ?Eulas Post, MD as PCP - General ? ?Indicate any recent Medical Services you may have received from other than [facility] providers in the past year (date may be approximate). ? ?   ?Assessment:  ? This is a routine wellness examination for Jaxyn. ? ?Hearing/Vision screen ?Hearing Screening - Comments:: No difficulty hearing ?Vision Screening - Comments:: Wears reading glasses. Followed by [facility] ? ?Dietary issues and exercise activities discussed: ?Exercise limited by: None identified ? ? Goals Addressed   ? ?  ?  ?  ?  ?  ? This Visit's Progress  ?  Patient Stated (pt-stated)     ?   Go to gym on a regular basis. ?  ? ?  ? ?Depression Screen ? ?  08/20/2021  ?  8:22 AM 08/14/2020  ?  8:13 AM 01/03/2018  ?  3:06 PM 04/09/2015  ? 11:32 AM  ?PHQ 2/9 Scores  ?PHQ - 2 Score 0 0 4 0  ?  ?Fall Risk ? ?  08/20/2021  ?  8:26 AM 08/14/2020  ?  8:16 AM 01/03/2018  ?  3:06 PM  ?Fall Risk   ?Falls in the past year? 0 0 No  ?Number falls in past yr: 0 0   ?Injury with Fall? 0 0   ?Risk for fall due to : No Fall Risks    ?Follow up  Falls prevention discussed   ? ? ?FALL RISK PREVENTION PERTAINING TO THE HOME: ? ?Any stairs in or around the home? Yes  ?If so, are there any without handrails? No  ?Home free of loose throw rugs in walkways, pet beds, electrical cords, etc? Yes  ?Adequate lighting in your home to reduce risk of falls? Yes  ? ?ASSISTIVE DEVICES UTILIZED TO PREVENT FALLS: ? ?Life alert? No  ?Use of a cane, walker or w/c? No  ?Grab bars in the bathroom? No  ?Shower chair or bench in shower? No  ?Elevated toilet seat or a handicapped toilet? No  ? ?TIMED UP AND GO: ? ?Was the test performed? No . Audio Visit ? ?Cognitive Function: ?  ?  ? ?  08/20/2021  ?  8:28 AM 08/14/2020  ?  8:19 AM  ?6CIT Screen  ?What Year? 0 points 0 points  ?What month? 0 points 0 points  ?What time? 0 points   ?Count back from 20 0 points 2 points  ?Months in reverse 0 points 0 points  ?Repeat phrase 0 points 0 points   ?Total Score 0 points   ? ? ?Immunizations ?Immunization History  ?Administered Date(s) Administered  ? Fluad Quad(high Dose 65+) 02/06/2019  ? Influenza,inj,Quad PF,6+ Mos 04/06/2014, 03/27/2015, 11/15/2

## 2021-08-20 NOTE — Patient Instructions (Addendum)
?Mr. Nettleton , ?Thank you for taking time to come for your Medicare Wellness Visit. I appreciate your ongoing commitment to your health goals. Please review the following plan we discussed and let me know if I can assist you in the future.  ? ?These are the goals we discussed: ? Goals   ? ?   Patient Stated (pt-stated)   ?   Go to gym on a regular basis. ?  ? ?  ?  ?This is a list of the screening recommended for you and due dates:  ?Health Maintenance  ?Topic Date Due  ? COVID-19 Vaccine (2 - Pfizer risk series) 10/05/2019  ? Colon Cancer Screening  03/17/2020  ? Flu Shot  12/23/2020  ? Zoster (Shingles) Vaccine (1 of 2) 11/20/2021*  ? Tetanus Vaccine  12/25/2029  ? Pneumonia Vaccine  Completed  ? Hepatitis C Screening: USPSTF Recommendation to screen - Ages 77-79 yo.  Completed  ? HPV Vaccine  Aged Out  ?*Topic was postponed. The date shown is not the original due date.  ? ?Advanced directives: Yes  ? ?Conditions/risks identified: None ? ?Next appointment: Follow up in one year for your annual wellness visit.  ? ?Preventive Care 22 Years and Older, Male ?Preventive care refers to lifestyle choices and visits with your health care provider that can promote health and wellness. ?What does preventive care include? ?A yearly physical exam. This is also called an annual well check. ?Dental exams once or twice a year. ?Routine eye exams. Ask your health care provider how often you should have your eyes checked. ?Personal lifestyle choices, including: ?Daily care of your teeth and gums. ?Regular physical activity. ?Eating a healthy diet. ?Avoiding tobacco and drug use. ?Limiting alcohol use. ?Practicing safe sex. ?Taking low doses of aspirin every day. ?Taking vitamin and mineral supplements as recommended by your health care provider. ?What happens during an annual well check? ?The services and screenings done by your health care provider during your annual well check will depend on your age, overall health, lifestyle  risk factors, and family history of disease. ?Counseling  ?Your health care provider may ask you questions about your: ?Alcohol use. ?Tobacco use. ?Drug use. ?Emotional well-being. ?Home and relationship well-being. ?Sexual activity. ?Eating habits. ?History of falls. ?Memory and ability to understand (cognition). ?Work and work Astronomer. ?Screening  ?You may have the following tests or measurements: ?Height, weight, and BMI. ?Blood pressure. ?Lipid and cholesterol levels. These may be checked every 5 years, or more frequently if you are over 34 years old. ?Skin check. ?Lung cancer screening. You may have this screening every year starting at age 78 if you have a 30-pack-year history of smoking and currently smoke or have quit within the past 15 years. ?Fecal occult blood test (FOBT) of the stool. You may have this test every year starting at age 54. ?Flexible sigmoidoscopy or colonoscopy. You may have a sigmoidoscopy every 5 years or a colonoscopy every 10 years starting at age 65. ?Prostate cancer screening. Recommendations will vary depending on your family history and other risks. ?Hepatitis C blood test. ?Hepatitis B blood test. ?Sexually transmitted disease (STD) testing. ?Diabetes screening. This is done by checking your blood sugar (glucose) after you have not eaten for a while (fasting). You may have this done every 1-3 years. ?Abdominal aortic aneurysm (AAA) screening. You may need this if you are a current or former smoker. ?Osteoporosis. You may be screened starting at age 6 if you are at high risk. ?Talk with  your health care provider about your test results, treatment options, and if necessary, the need for more tests. ?Vaccines  ?Your health care provider may recommend certain vaccines, such as: ?Influenza vaccine. This is recommended every year. ?Tetanus, diphtheria, and acellular pertussis (Tdap, Td) vaccine. You may need a Td booster every 10 years. ?Zoster vaccine. You may need this after age  68. ?Pneumococcal 13-valent conjugate (PCV13) vaccine. One dose is recommended after age 55. ?Pneumococcal polysaccharide (PPSV23) vaccine. One dose is recommended after age 89. ?Talk to your health care provider about which screenings and vaccines you need and how often you need them. ?This information is not intended to replace advice given to you by your health care provider. Make sure you discuss any questions you have with your health care provider. ?Document Released: 06/07/2015 Document Revised: 01/29/2016 Document Reviewed: 03/12/2015 ?Elsevier Interactive Patient Education ? 2017 Elsevier Inc. ? ?Fall Prevention in the Home ?Falls can cause injuries. They can happen to people of all ages. There are many things you can do to make your home safe and to help prevent falls. ?What can I do on the outside of my home? ?Regularly fix the edges of walkways and driveways and fix any cracks. ?Remove anything that might make you trip as you walk through a door, such as a raised step or threshold. ?Trim any bushes or trees on the path to your home. ?Use bright outdoor lighting. ?Clear any walking paths of anything that might make someone trip, such as rocks or tools. ?Regularly check to see if handrails are loose or broken. Make sure that both sides of any steps have handrails. ?Any raised decks and porches should have guardrails on the edges. ?Have any leaves, snow, or ice cleared regularly. ?Use sand or salt on walking paths during winter. ?Clean up any spills in your garage right away. This includes oil or grease spills. ?What can I do in the bathroom? ?Use night lights. ?Install grab bars by the toilet and in the tub and shower. Do not use towel bars as grab bars. ?Use non-skid mats or decals in the tub or shower. ?If you need to sit down in the shower, use a plastic, non-slip stool. ?Keep the floor dry. Clean up any water that spills on the floor as soon as it happens. ?Remove soap buildup in the tub or shower  regularly. ?Attach bath mats securely with double-sided non-slip rug tape. ?Do not have throw rugs and other things on the floor that can make you trip. ?What can I do in the bedroom? ?Use night lights. ?Make sure that you have a light by your bed that is easy to reach. ?Do not use any sheets or blankets that are too big for your bed. They should not hang down onto the floor. ?Have a firm chair that has side arms. You can use this for support while you get dressed. ?Do not have throw rugs and other things on the floor that can make you trip. ?What can I do in the kitchen? ?Clean up any spills right away. ?Avoid walking on wet floors. ?Keep items that you use a lot in easy-to-reach places. ?If you need to reach something above you, use a strong step stool that has a grab bar. ?Keep electrical cords out of the way. ?Do not use floor polish or wax that makes floors slippery. If you must use wax, use non-skid floor wax. ?Do not have throw rugs and other things on the floor that can make you trip. ?  What can I do with my stairs? ?Do not leave any items on the stairs. ?Make sure that there are handrails on both sides of the stairs and use them. Fix handrails that are broken or loose. Make sure that handrails are as long as the stairways. ?Check any carpeting to make sure that it is firmly attached to the stairs. Fix any carpet that is loose or worn. ?Avoid having throw rugs at the top or bottom of the stairs. If you do have throw rugs, attach them to the floor with carpet tape. ?Make sure that you have a light switch at the top of the stairs and the bottom of the stairs. If you do not have them, ask someone to add them for you. ?What else can I do to help prevent falls? ?Wear shoes that: ?Do not have high heels. ?Have rubber bottoms. ?Are comfortable and fit you well. ?Are closed at the toe. Do not wear sandals. ?If you use a stepladder: ?Make sure that it is fully opened. Do not climb a closed stepladder. ?Make sure that  both sides of the stepladder are locked into place. ?Ask someone to hold it for you, if possible. ?Clearly mark and make sure that you can see: ?Any grab bars or handrails. ?First and last steps. ?Where th

## 2021-09-04 ENCOUNTER — Other Ambulatory Visit: Payer: Self-pay | Admitting: Family Medicine

## 2021-09-19 ENCOUNTER — Ambulatory Visit (INDEPENDENT_AMBULATORY_CARE_PROVIDER_SITE_OTHER): Payer: Medicare HMO | Admitting: Family Medicine

## 2021-09-19 ENCOUNTER — Encounter: Payer: Self-pay | Admitting: Family Medicine

## 2021-09-19 VITALS — BP 120/60 | HR 63 | Temp 97.6°F | Ht 71.26 in | Wt 189.2 lb

## 2021-09-19 DIAGNOSIS — Z1211 Encounter for screening for malignant neoplasm of colon: Secondary | ICD-10-CM | POA: Diagnosis not present

## 2021-09-19 DIAGNOSIS — Z0000 Encounter for general adult medical examination without abnormal findings: Secondary | ICD-10-CM | POA: Diagnosis not present

## 2021-09-19 LAB — COMPREHENSIVE METABOLIC PANEL
ALT: 20 U/L (ref 0–53)
AST: 25 U/L (ref 0–37)
Albumin: 4.3 g/dL (ref 3.5–5.2)
Alkaline Phosphatase: 64 U/L (ref 39–117)
BUN: 29 mg/dL — ABNORMAL HIGH (ref 6–23)
CO2: 28 mEq/L (ref 19–32)
Calcium: 9.5 mg/dL (ref 8.4–10.5)
Chloride: 105 mEq/L (ref 96–112)
Creatinine, Ser: 1.27 mg/dL (ref 0.40–1.50)
GFR: 57.95 mL/min — ABNORMAL LOW (ref >60.00)
Glucose, Bld: 107 mg/dL — ABNORMAL HIGH (ref 70–99)
Potassium: 4 mEq/L (ref 3.5–5.1)
Sodium: 140 mEq/L (ref 135–145)
Total Bilirubin: 0.7 mg/dL (ref 0.2–1.2)
Total Protein: 7 g/dL (ref 6.0–8.3)

## 2021-09-19 LAB — LIPID PANEL
Cholesterol: 95 mg/dL (ref 0–200)
HDL: 29.8 mg/dL — ABNORMAL LOW (ref >39.00)
LDL Cholesterol: 50 mg/dL (ref 0–99)
NonHDL: 65.42
Total CHOL/HDL Ratio: 3
Triglycerides: 78 mg/dL (ref 0.0–149.0)
VLDL: 15.6 mg/dL (ref 0.0–40.0)

## 2021-09-19 LAB — HEMOGLOBIN A1C: Hgb A1c MFr Bld: 6.1 % (ref 4.6–6.5)

## 2021-09-19 MED ORDER — METOPROLOL SUCCINATE ER 50 MG PO TB24: ORAL_TABLET | ORAL | 3 refills | Status: DC

## 2021-09-19 MED ORDER — LOSARTAN POTASSIUM 100 MG PO TABS: 100.0000 mg | ORAL_TABLET | Freq: Every day | ORAL | 3 refills | Status: DC

## 2021-09-19 MED ORDER — AMLODIPINE BESYLATE 10 MG PO TABS: 10.0000 mg | ORAL_TABLET | Freq: Every day | ORAL | 3 refills | Status: DC

## 2021-09-19 MED ORDER — ATORVASTATIN CALCIUM 40 MG PO TABS: ORAL_TABLET | ORAL | 3 refills | Status: DC

## 2021-09-19 NOTE — Progress Notes (Signed)
? ?Established Patient Office Visit ? ?Subjective   ?Patient ID: Eugene Daniel, male    DOB: [birth year]  Age: 71 y.o. MRN: [id number] ? ?Chief Complaint  ?Patient presents with  ? Annual Exam  ? ? ?HPI ? ?Past Medical History:  ?Diagnosis Date  ? Cataract   ? CORONARY ARTERY DISEASE   ? Cath 2005. LAD 75% stenosis in a small vessel. EF 45-50%.  ? ELEVATED PROSTATE SPECIFIC ANTIGEN 01/01/2009  ? HYPERLIPIDEMIA 01/01/2009  ? Kidney stone   ? ?Social History  ? ?Socioeconomic History  ? Marital status: Married  ?  Spouse name: Not on file  ? Number of children: 4  ? Years of education: Not on file  ? Highest education level: Not on file  ?Occupational History  ? Occupation: retired  ?Tobacco Use  ? Smoking status: Light Smoker  ?  Packs/day: 0.25  ?  Types: Cigarettes  ?  Last attempt to quit: 04/06/2014  ?  Years since quitting: 7.4  ? Smokeless tobacco: Never  ? Tobacco comments:  ?  2 cigs per day.    ?Vaping Use  ? Vaping Use: Never used  ?Substance and Sexual Activity  ? Alcohol use: Yes  ?  Comment: occasional wine and beer  ? Drug use: No  ? Sexual activity: Not on file  ?Other Topics Concern  ? Not on file  ?Social History Narrative  ? Not on file  ? ?Social Determinants of Health  ? ?Financial Resource Strain: Low Risk   ? Difficulty of Paying Living Expenses: Not hard at all  ?Food Insecurity: No Food Insecurity  ? Worried About Charity fundraiser in the Last Year: Never true  ? Ran Out of Food in the Last Year: Never true  ?Transportation Needs: No Transportation Needs  ? Lack of Transportation (Medical): No  ? Lack of Transportation (Non-Medical): No  ?Physical Activity: Inactive  ? Days of Exercise per Week: 0 days  ? Minutes of Exercise per Session: 0 min  ?Stress: No Stress Concern Present  ? Feeling of Stress : Not at all  ?Social Connections: Unknown  ? Frequency of Communication with Friends and Family: More than three times a week  ? Frequency of Social Gatherings with Friends and Family: More than  three times a week  ? Attends Religious Services: More than 4 times per year  ? Active Member of Clubs or Organizations: Yes  ? Attends Archivist Meetings: More than 4 times per year  ? Marital Status: Not on file  ?Intimate Partner Violence: Not At Risk  ? Fear of Current or Ex-Partner: No  ? Emotionally Abused: No  ? Physically Abused: No  ? Sexually Abused: No  ? ?Family History  ?Problem Relation Age of Onset  ? Colon cancer Father   ? Heart disease Paternal Grandfather   ? Leukemia Paternal Grandfather   ? Stomach cancer Neg Hx   ? Rectal cancer Neg Hx   ? ?No Known Allergies ?  ?Eugene Daniel is seen for physical exam and medical follow-up.  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  He has made some positive lifestyle changes with reducing overall calories.  He has done this by eating 2 meals per day instead of 3 and less snacks.  Weight down from 208 pounds 2020 to current weight of 189 pounds.  Currently lives up in [place].  Does still smoke but just a few cigarettes per day. ? ?Health maintenance reviewed ? ?-Overdue for colon cancer  screening.  No history of polyps.  He declines colonoscopy but does agree to [employer] ?-Previous shingles vaccine given ?-Tetanus due 2031 ?-Prior hepatitis C screen negative ?-Pneumonia vaccines complete ?-Declines PSA screening ?-Declines low-dose CT lung cancer screening. ? ?Family history and social history reviewed with no significant changes ? ?Past Medical History:  ?Diagnosis Date  ? Cataract   ? CORONARY ARTERY DISEASE   ? Cath 2005. LAD 75% stenosis in a small vessel. EF 45-50%.  ? ELEVATED PROSTATE SPECIFIC ANTIGEN 01/01/2009  ? HYPERLIPIDEMIA 01/01/2009  ? Kidney stone   ? ?Past Surgical History:  ?Procedure Laterality Date  ? EYE SURGERY    ? cataracts  ? TONSILLECTOMY    ? ? reports that he has been smoking cigarettes. He has been smoking an average of .25 packs per day. He has never used smokeless tobacco. He reports current alcohol use. He reports  that he does not use drugs. ?family history includes Colon cancer in his father; Heart disease in his paternal grandfather; Leukemia in his paternal grandfather. ?No Known Allergies ? ?Review of Systems  ?Constitutional:  Negative for chills and fever.  ?HENT:  Negative for sore throat.   ?Eyes:  Negative for blurred vision and double vision.  ?Respiratory:  Negative for cough and shortness of breath.   ?Cardiovascular:  Negative for chest pain.  ?Gastrointestinal:  Negative for nausea and vomiting.  ?Genitourinary:  Negative for dysuria.  ?Neurological:  Negative for dizziness.  ?Psychiatric/Behavioral:  Negative for depression.   ? ?  ?Objective:  ?  ? ?BP 120/60 (BP Location: Left Arm, Patient Position: Sitting, Cuff Size: Normal)   Pulse 63   Temp 97.6 ?F (36.4 ?C) (Oral)   Ht 5' 11.26" (1.81 m)   Wt 189 lb 3.2 oz (85.8 kg)   SpO2 97%   BMI 26.20 kg/m?  ?BP Readings from Last 3 Encounters:  ?09/19/21 120/60  ?02/06/19 (!) 150/84  ?02/08/18 122/70  ? ?Wt Readings from Last 3 Encounters:  ?09/19/21 189 lb 3.2 oz (85.8 kg)  ?08/20/21 186 lb (84.4 kg)  ?01/29/21 195 lb (88.5 kg)  ? ?  ? ?Physical Exam ?Vitals reviewed.  ?Constitutional:   ?   Appearance: Normal appearance.  ?HENT:  ?   Right Ear: Tympanic membrane normal.  ?   Left Ear: Tympanic membrane normal.  ?Cardiovascular:  ?   Rate and Rhythm: Normal rate and regular rhythm.  ?Pulmonary:  ?   Effort: Pulmonary effort is normal.  ?   Breath sounds: Normal breath sounds.  ?Musculoskeletal:  ?   Cervical back: Neck supple.  ?   Right lower leg: No edema.  ?   Left lower leg: No edema.  ?Lymphadenopathy:  ?   Cervical: No cervical adenopathy.  ?Skin: ?   Findings: No rash.  ?   Comments: Does have diffuse vitiligo which he has had for several years  ?Neurological:  ?   General: No focal deficit present.  ?   Mental Status: He is alert.  ?   Cranial Nerves: No cranial nerve deficit.  ? ? ? ?No results found for any visits on 09/19/21. ? ?Last CBC ?Lab  Results  ?Component Value Date  ? WBC 9.7 02/06/2019  ? HGB 15.8 02/06/2019  ? HCT 45.9 02/06/2019  ? MCV 90.8 02/06/2019  ? RDW 13.2 02/06/2019  ? PLT 165.0 02/06/2019  ? ?Last lipids ?Lab Results  ?Component Value Date  ? CHOL 141 02/06/2019  ? HDL 33.70 (L) 02/06/2019  ?  Hopkins 72 02/06/2019  ? LDLDIRECT 90.9 02/10/2012  ? TRIG 175.0 (H) 02/06/2019  ? CHOLHDL 4 02/06/2019  ? ?Last hemoglobin A1c ?Lab Results  ?Component Value Date  ? HGBA1C 6.2 02/06/2019  ? ?  ? ?The 10-year ASCVD risk score (Arnett DK, et al., 2019) is: 20.7% ? ?  ?Assessment & Plan:  ? ?Problem List Items Addressed This Visit   ?None ?Visit Diagnoses   ? ? Screening for colon cancer    -  Primary  ? Relevant Orders  ? Cologuard  ? Physical exam      ? Relevant Orders  ? Lipid panel  ? Comprehensive metabolic panel  ? Hemoglobin A1c  ? ?  ?-Refilled all medications for 1 year ? ?The natural history of prostate cancer and ongoing controversy regarding screening and potential treatment outcomes of prostate cancer has been discussed with the patient. The meaning of a false positive PSA and a false negative PSA has been discussed. He indicates understanding of the limitations of this screening test and wishes not to proceed with screening PSA testing ? ?-Discussed colon cancer screening.  He declines colonoscopy but agrees to Cologuard ? ?-Discussed low-dose CT lung cancer screening and he declines. ? ?-Strongly encouraged to stop smoking completely ? ?-Engage in regular physical activity such as walking ?No follow-ups on file.  ? ? ?Carolann Littler, MD ? ?

## 2021-10-08 DIAGNOSIS — Z1211 Encounter for screening for malignant neoplasm of colon: Secondary | ICD-10-CM | POA: Diagnosis not present

## 2021-10-17 LAB — COLOGUARD: COLOGUARD: NEGATIVE

## 2022-03-02 ENCOUNTER — Telehealth: Payer: Self-pay | Admitting: Family Medicine

## 2022-03-02 MED ORDER — METOPROLOL SUCCINATE ER 50 MG PO TB24: ORAL_TABLET | ORAL | 3 refills | Status: DC

## 2022-03-02 NOTE — Telephone Encounter (Signed)
Pt called, stating he may have a missed call from [facility]. I did not see a note by CMA. CMA was unavailable. Pt asked that CMA call back at his earliest convenience.

## 2022-03-02 NOTE — Telephone Encounter (Signed)
Pt is calling and does not know if he still suppose to be taking metoprolol succinate (TOPROL-XL) 50 MG 24 hr tablet if so please send a refill to  [id number] [place], [place] - [place] Phone:  [phone]  Fax:  [phone]

## 2022-03-02 NOTE — Telephone Encounter (Signed)
Rx sent 

## 2022-08-19 ENCOUNTER — Telehealth: Payer: Self-pay | Admitting: Family Medicine

## 2022-08-19 NOTE — Telephone Encounter (Signed)
Contacted Star Age to schedule their annual wellness visit. Appointment made for 08/24/22.  [employer]arkley [employer] AWV direct phone # [phone]   [facility] out of office   changed time to 3 on 4/1 with Nickeah Pt aware

## 2022-08-24 ENCOUNTER — Ambulatory Visit (INDEPENDENT_AMBULATORY_CARE_PROVIDER_SITE_OTHER): Payer: Medicare HMO

## 2022-08-24 VITALS — Ht 72.0 in | Wt 195.0 lb

## 2022-08-24 DIAGNOSIS — Z0000 Encounter for general adult medical examination without abnormal findings: Secondary | ICD-10-CM

## 2022-08-24 NOTE — Progress Notes (Signed)
I connected with  Star Age on 08/24/22 by a audio enabled telemedicine application and verified that I am speaking with the correct person using two identifiers.  Patient Location: Home  Provider Location: Office/Clinic  I discussed the limitations of evaluation and management by telemedicine. The patient expressed understanding and agreed to proceed.  Subjective:   Eugene Daniel is a 72 y.o. male who presents for Medicare Annual/Subsequent preventive examination.  Review of Systems     Cardiac Risk Factors include: advanced age (>68men, >7 women);dyslipidemia;hypertension;male gender     Objective:    Today's Vitals   08/24/22 1501  Weight: 195 lb (88.5 kg)  Height: 6' (1.829 m)   Body mass index is 26.45 kg/m.     08/24/2022    3:06 PM 08/20/2021    8:28 AM 08/14/2020    8:15 AM  Advanced Directives  Does Patient Have a Medical Advance Directive? Yes Yes Yes  Type of Paramedic of [place];Living will Living will [place];Living will  Does patient want to make changes to medical advance directive?  No - Patient declined   Copy of [place] in Chart? No - copy requested  No - copy requested    Current Medications (verified) Outpatient Encounter Medications as of 08/24/2022  Medication Sig   amLODipine (NORVASC) 10 MG tablet Take 1 tablet (10 mg total) by mouth daily.   aspirin 81 MG EC tablet Take by mouth.   atorvastatin (LIPITOR) 40 MG tablet TAKE 1 TABLET BY MOUTH DAILY AT 6 PM.   losartan (COZAAR) 100 MG tablet Take 1 tablet (100 mg total) by mouth daily.   metoprolol succinate (TOPROL-XL) 50 MG 24 hr tablet TAKE 1 TABLET (50 MG TOTAL) BY MOUTH DAILY OR IMMEDIATELY FOLLOWING A MEAL.   Multiple Vitamin (MULTIVITAMIN) tablet Take 1 tablet by mouth daily.   Bromfenac Sodium (PROLENSA) 0.07 % SOLN  (Patient not taking: Reported on 08/24/2022)   PROLENSA 0.07 % SOLN  (Patient not taking: Reported on  08/24/2022)   No facility-administered encounter medications on file as of 08/24/2022.    Allergies (verified) Patient has no known allergies.   History: Past Medical History:  Diagnosis Date   Cataract    CORONARY ARTERY DISEASE    Cath 2005. LAD 75% stenosis in a small vessel. EF 45-50%.   ELEVATED PROSTATE SPECIFIC ANTIGEN 01/01/2009   HYPERLIPIDEMIA 01/01/2009   Kidney stone    Past Surgical History:  Procedure Laterality Date   EYE SURGERY     cataracts   TONSILLECTOMY     Family History  Problem Relation Age of Onset   Colon cancer Father    Heart disease Paternal Grandfather    Leukemia Paternal Grandfather    Stomach cancer Neg Hx    Rectal cancer Neg Hx    Social History   Socioeconomic History   Marital status: Married    Spouse name: Not on file   Number of children: 4   Years of education: Not on file   Highest education level: Not on file  Occupational History   Occupation: retired  Tobacco Use   Smoking status: Light Smoker    Packs/day: .25    Types: Cigarettes    Last attempt to quit: 04/06/2014    Years since quitting: 8.3   Smokeless tobacco: Never   Tobacco comments:    2 cigs per day.    Vaping Use   Vaping Use: Never used  Substance and  Sexual Activity   Alcohol use: Yes    Comment: occasional wine and beer   Drug use: No   Sexual activity: Not on file  Other Topics Concern   Not on file  Social History Narrative   Not on file   Social Determinants of Health   Financial Resource Strain: Low Risk  (08/24/2022)   Overall Financial Resource Strain (CARDIA)    Difficulty of Paying Living Expenses: Not hard at all  Food Insecurity: No Food Insecurity (08/24/2022)   Hunger Vital Sign    Worried About Running Out of Food in the Last Year: Never true    Ran Out of Food in the Last Year: Never true  Transportation Needs: No Transportation Needs (08/24/2022)   PRAPARE - Hydrologist (Medical): No    Lack of  Transportation (Non-Medical): No  Physical Activity: Inactive (08/24/2022)   Exercise Vital Sign    Days of Exercise per Week: 0 days    Minutes of Exercise per Session: 0 min  Stress: No Stress Concern Present (08/24/2022)   [place]    Feeling of Stress : Not at all  Social Connections: Unknown (08/20/2021)   Social Connection and Isolation Panel [NHANES]    Frequency of Communication with Friends and Family: More than three times a week    Frequency of Social Gatherings with Friends and Family: More than three times a week    Attends Religious Services: More than 4 times per year    Active Member of [employer] or Organizations: Yes    Attends Music therapist: More than 4 times per year    Marital Status: Not on file    Tobacco Counseling Ready to quit: Not Answered Counseling given: Not Answered Tobacco comments: 2 cigs per day.     Clinical Intake:  Pre-visit preparation completed: Yes  Pain : No/denies pain     Nutritional Status: BMI 25 -29 Overweight Nutritional Risks: None Diabetes: No  How often do you need to have someone help you when you read instructions, pamphlets, or other written materials from your doctor or pharmacy?: 1 - Never  Diabetic? no  Interpreter Needed?: No  Information entered by :: NAllen LPN   Activities of Daily Living    08/24/2022    3:06 PM  In your present state of health, do you have any difficulty performing the following activities:  Hearing? 0  Vision? 0  Difficulty concentrating or making decisions? 0  Walking or climbing stairs? 0  Dressing or bathing? 0  Doing errands, shopping? 0  Preparing Food and eating ? N  Using the Toilet? N  In the past six months, have you accidently leaked urine? N  Do you have problems with loss of bowel control? N  Managing your Medications? N  Managing your Finances? N  Housekeeping or managing your Housekeeping? N     Patient Care Team: Eulas Post, MD as PCP - General  Indicate any recent Medical Services you may have received from other than [facility] providers in the past year (date may be approximate).     Assessment:   This is a routine wellness examination for [place].  Hearing/Vision screen Vision Screening - Comments:: No regular eye exams  Dietary issues and exercise activities discussed: Current Exercise Habits: The patient does not participate in regular exercise at present (works in yard)   Goals Addressed  This Visit's Progress    Patient Stated       08/24/2022, wants to get down to 185 pounds       Depression Screen    08/24/2022    3:06 PM 08/20/2021    8:22 AM 08/14/2020    8:13 AM 01/03/2018    3:06 PM 04/09/2015   11:32 AM  PHQ 2/9 Scores  PHQ - 2 Score 0 0 0 4 0    Fall Risk    08/24/2022    3:06 PM 08/20/2021    8:26 AM 08/14/2020    8:16 AM 01/03/2018    3:06 PM  Fall Risk   Falls in the past year? 0 0 0 No  Number falls in past yr: 0 0 0   Injury with Fall? 0 0 0   Risk for fall due to : Medication side effect No Fall Risks    Follow up Falls prevention discussed;Education provided;Falls evaluation completed  Falls prevention discussed     FALL RISK PREVENTION PERTAINING TO THE HOME:  Any stairs in or around the home? Yes  If so, are there any without handrails? No  Home free of loose throw rugs in walkways, pet beds, electrical cords, etc? Yes  Adequate lighting in your home to reduce risk of falls? Yes   ASSISTIVE DEVICES UTILIZED TO PREVENT FALLS:  Life alert? No  Use of a cane, walker or w/c? No  Grab bars in the bathroom? No  Shower chair or bench in shower? No  Elevated toilet seat or a handicapped toilet? No   TIMED UP AND GO:  Was the test performed? No .      Cognitive Function:        08/24/2022    3:07 PM 08/20/2021    8:28 AM 08/14/2020    8:19 AM  6CIT Screen  What Year? 0 points 0 points 0 points  What  month? 0 points 0 points 0 points  What time? 3 points 0 points   Count back from 20 0 points 0 points 2 points  Months in reverse 0 points 0 points 0 points  Repeat phrase 2 points 0 points 0 points  Total Score 5 points 0 points     Immunizations Immunization History  Administered Date(s) Administered   Fluad Quad(high Dose 65+) 02/06/2019   Influenza,inj,Quad PF,6+ Mos 04/06/2014, 03/27/2015, 04/09/2015, 03/04/2016   PFIZER(Purple Top)SARS-COV-2 Vaccination 09/14/2019   Pneumococcal Conjugate-13 01/03/2018   Pneumococcal Polysaccharide-23 01/02/2010, 02/06/2019   Td 01/02/2010   Tdap 12/26/2019    TDAP status: Up to date  Flu Vaccine status: Up to date  Pneumococcal vaccine status: Up to date  Covid-19 vaccine status: Completed vaccines  Qualifies for Shingles Vaccine? Yes   Zostavax completed No   Shingrix Completed?: No.    Education has been provided regarding the importance of this vaccine. Patient has been advised to call insurance company to determine out of pocket expense if they have not yet received this vaccine. Advised may also receive vaccine at local pharmacy or Health Dept. Verbalized acceptance and understanding.  Screening Tests Health Maintenance  Topic Date Due   Zoster Vaccines- Shingrix (1 of 2) Never done   COVID-19 Vaccine (2 - Pfizer risk series) 10/05/2019   COLONOSCOPY (Pts 45-48yrs Insurance coverage will need to be confirmed)  03/17/2020   Medicare Annual Wellness (AWV)  08/21/2022   INFLUENZA VACCINE  12/24/2022   DTaP/Tdap/Td (3 - Td or Tdap) 12/25/2029   Pneumonia Vaccine 64+ Years old  Completed   Hepatitis C Screening  Completed   HPV VACCINES  Aged Out    Health Maintenance  Health Maintenance Due  Topic Date Due   Zoster Vaccines- Shingrix (1 of 2) Never done   COVID-19 Vaccine (2 - Pfizer risk series) 10/05/2019   COLONOSCOPY (Pts 45-72yrs Insurance coverage will need to be confirmed)  03/17/2020   Medicare Annual Wellness  (AWV)  08/21/2022    Colorectal cancer screening: Type of screening: Cologuard. Completed 10/08/2021. Repeat every 3 years  Lung Cancer Screening: (Low Dose CT Chest recommended if Age 62-80 years, 30 pack-year currently smoking OR have quit w/in 15years.) does not qualify.   Lung Cancer Screening Referral: no  Additional Screening:  Hepatitis C Screening: does qualify; Completed 02/08/2018  Vision Screening: Recommended annual ophthalmology exams for early detection of glaucoma and other disorders of the eye. Is the patient up to date with their annual eye exam?  No  Who is the provider or what is the name of the office in which the patient attends annual eye exams? none If pt is not established with a provider, would they like to be referred to a provider to establish care? No .   Dental Screening: Recommended annual dental exams for proper oral hygiene  Community Resource Referral / Chronic Care Management: CRR required this visit?  No   CCM required this visit?  No      Plan:     I have personally reviewed and noted the following in the patient's chart:   Medical and social history Use of alcohol, tobacco or illicit drugs  Current medications and supplements including opioid prescriptions. Patient is not currently taking opioid prescriptions. Functional ability and status Nutritional status Physical activity Advanced directives List of other physicians Hospitalizations, surgeries, and ER visits in previous 12 months Vitals Screenings to include cognitive, depression, and falls Referrals and appointments  In addition, I have reviewed and discussed with patient certain preventive protocols, quality metrics, and best practice recommendations. A written personalized care plan for preventive services as well as general preventive health recommendations were provided to patient.     Kellie Simmering, LPN   [id number]   Nurse Notes: none  Due to this being a virtual  visit, the after visit summary with patients personalized plan was offered to patient via mail or my-chart.  to pick up at office at next visit

## 2022-08-24 NOTE — Patient Instructions (Signed)
Eugene Daniel , Thank you for taking time to come for your Medicare Wellness Visit. I appreciate your ongoing commitment to your health goals. Please review the following plan we discussed and let me know if I can assist you in the future.   These are the goals we discussed:  Goals       Patient Stated (pt-stated)      Go to gym on a regular basis.      Patient Stated      08/24/2022, wants to get down to 185 pounds        This is a list of the screening recommended for you and due dates:  Health Maintenance  Topic Date Due   Zoster (Shingles) Vaccine (1 of 2) Never done   COVID-19 Vaccine (2 - Pfizer risk series) 10/05/2019   Colon Cancer Screening  03/17/2020   Flu Shot  12/24/2022   Medicare Annual Wellness Visit  08/24/2023   DTaP/Tdap/Td vaccine (3 - Td or Tdap) 12/25/2029   Pneumonia Vaccine  Completed   Hepatitis C Screening: USPSTF Recommendation to screen - Ages 2-79 yo.  Completed   HPV Vaccine  Aged Out    Advanced directives: Please bring a copy of your POA (Power of [place]) and/or Living Will to your next appointment.   Conditions/risks identified: smoking  Next appointment: Follow up in one year for your annual wellness visit.   Preventive Care 70 Years and Older, Male  Preventive care refers to lifestyle choices and visits with your health care provider that can promote health and wellness. What does preventive care include? A yearly physical exam. This is also called an annual well check. Dental exams once or twice a year. Routine eye exams. Ask your health care provider how often you should have your eyes checked. Personal lifestyle choices, including: Daily care of your teeth and gums. Regular physical activity. Eating a healthy diet. Avoiding tobacco and drug use. Limiting alcohol use. Practicing safe sex. Taking low doses of aspirin every day. Taking vitamin and mineral supplements as recommended by your health care provider. What happens during an  annual well check? The services and screenings done by your health care provider during your annual well check will depend on your age, overall health, lifestyle risk factors, and family history of disease. Counseling  Your health care provider may ask you questions about your: Alcohol use. Tobacco use. Drug use. Emotional well-being. Home and relationship well-being. Sexual activity. Eating habits. History of falls. Memory and ability to understand (cognition). Work and work Statistician. Screening  You may have the following tests or measurements: Height, weight, and BMI. Blood pressure. Lipid and cholesterol levels. These may be checked every 5 years, or more frequently if you are over 29 years old. Skin check. Lung cancer screening. You may have this screening every year starting at age 45 if you have a 30-pack-year history of smoking and currently smoke or have quit within the past 15 years. Fecal occult blood test (FOBT) of the stool. You may have this test every year starting at age 4. Flexible sigmoidoscopy or colonoscopy. You may have a sigmoidoscopy every 5 years or a colonoscopy every 10 years starting at age 35. Prostate cancer screening. Recommendations will vary depending on your family history and other risks. Hepatitis C blood test. Hepatitis B blood test. Sexually transmitted disease (STD) testing. Diabetes screening. This is done by checking your blood sugar (glucose) after you have not eaten for a while (fasting). You may have this  done every 1-3 years. Abdominal aortic aneurysm (AAA) screening. You may need this if you are a current or former smoker. Osteoporosis. You may be screened starting at age 32 if you are at high risk. Talk with your health care provider about your test results, treatment options, and if necessary, the need for more tests. Vaccines  Your health care provider may recommend certain vaccines, such as: Influenza vaccine. This is recommended  every year. Tetanus, diphtheria, and acellular pertussis (Tdap, Td) vaccine. You may need a Td booster every 10 years. Zoster vaccine. You may need this after age 76. Pneumococcal 13-valent conjugate (PCV13) vaccine. One dose is recommended after age 18. Pneumococcal polysaccharide (PPSV23) vaccine. One dose is recommended after age 34. Talk to your health care provider about which screenings and vaccines you need and how often you need them. This information is not intended to replace advice given to you by your health care provider. Make sure you discuss any questions you have with your health care provider. Document Released: 06/07/2015 Document Revised: 01/29/2016 Document Reviewed: 03/12/2015 Elsevier Interactive Patient Education  2017 [employer] Prevention in the Home Falls can cause injuries. They can happen to people of all ages. There are many things you can do to make your home safe and to help prevent falls. What can I do on the outside of my home? Regularly fix the edges of walkways and driveways and fix any cracks. Remove anything that might make you trip as you walk through a door, such as a raised step or threshold. Trim any bushes or trees on the path to your home. Use bright outdoor lighting. Clear any walking paths of anything that might make someone trip, such as rocks or tools. Regularly check to see if handrails are loose or broken. Make sure that both sides of any steps have handrails. Any raised decks and porches should have guardrails on the edges. Have any leaves, snow, or ice cleared regularly. Use sand or salt on walking paths during winter. Clean up any spills in your garage right away. This includes oil or grease spills. What can I do in the bathroom? Use night lights. Install grab bars by the toilet and in the tub and shower. Do not use towel bars as grab bars. Use non-skid mats or decals in the tub or shower. If you need to sit down in the shower,  use a plastic, non-slip stool. Keep the floor dry. Clean up any water that spills on the floor as soon as it happens. Remove soap buildup in the tub or shower regularly. Attach bath mats securely with double-sided non-slip rug tape. Do not have throw rugs and other things on the floor that can make you trip. What can I do in the bedroom? Use night lights. Make sure that you have a light by your bed that is easy to reach. Do not use any sheets or blankets that are too big for your bed. They should not hang down onto the floor. Have a firm chair that has side arms. You can use this for support while you get dressed. Do not have throw rugs and other things on the floor that can make you trip. What can I do in the kitchen? Clean up any spills right away. Avoid walking on wet floors. Keep items that you use a lot in easy-to-reach places. If you need to reach something above you, use a strong step stool that has a grab bar. Keep electrical cords out of  the way. Do not use floor polish or wax that makes floors slippery. If you must use wax, use non-skid floor wax. Do not have throw rugs and other things on the floor that can make you trip. What can I do with my stairs? Do not leave any items on the stairs. Make sure that there are handrails on both sides of the stairs and use them. Fix handrails that are broken or loose. Make sure that handrails are as long as the stairways. Check any carpeting to make sure that it is firmly attached to the stairs. Fix any carpet that is loose or worn. Avoid having throw rugs at the top or bottom of the stairs. If you do have throw rugs, attach them to the floor with carpet tape. Make sure that you have a light switch at the top of the stairs and the bottom of the stairs. If you do not have them, ask someone to add them for you. What else can I do to help prevent falls? Wear shoes that: Do not have high heels. Have rubber bottoms. Are comfortable and fit you  well. Are closed at the toe. Do not wear sandals. If you use a stepladder: Make sure that it is fully opened. Do not climb a closed stepladder. Make sure that both sides of the stepladder are locked into place. Ask someone to hold it for you, if possible. Clearly mark and make sure that you can see: Any grab bars or handrails. First and last steps. Where the edge of each step is. Use tools that help you move around (mobility aids) if they are needed. These include: Canes. Walkers. Scooters. Crutches. Turn on the lights when you go into a dark area. Replace any light bulbs as soon as they burn out. Set up your furniture so you have a clear path. Avoid moving your furniture around. If any of your floors are uneven, fix them. If there are any pets around you, be aware of where they are. Review your medicines with your doctor. Some medicines can make you feel dizzy. This can increase your chance of falling. Ask your doctor what other things that you can do to help prevent falls. This information is not intended to replace advice given to you by your health care provider. Make sure you discuss any questions you have with your health care provider. Document Released: 03/07/2009 Document Revised: 10/17/2015 Document Reviewed: 06/15/2014 Elsevier Interactive Patient Education  2017 [employer].

## 2022-09-10 DIAGNOSIS — M1990 Unspecified osteoarthritis, unspecified site: Secondary | ICD-10-CM | POA: Diagnosis not present

## 2022-09-10 DIAGNOSIS — I129 Hypertensive chronic kidney disease with stage 1 through stage 4 chronic kidney disease, or unspecified chronic kidney disease: Secondary | ICD-10-CM | POA: Diagnosis not present

## 2022-09-10 DIAGNOSIS — Z7982 Long term (current) use of aspirin: Secondary | ICD-10-CM | POA: Diagnosis not present

## 2022-09-10 DIAGNOSIS — R69 Illness, unspecified: Secondary | ICD-10-CM | POA: Diagnosis not present

## 2022-09-10 DIAGNOSIS — Z833 Family history of diabetes mellitus: Secondary | ICD-10-CM | POA: Diagnosis not present

## 2022-09-10 DIAGNOSIS — N189 Chronic kidney disease, unspecified: Secondary | ICD-10-CM | POA: Diagnosis not present

## 2022-09-10 DIAGNOSIS — E785 Hyperlipidemia, unspecified: Secondary | ICD-10-CM | POA: Diagnosis not present

## 2022-09-10 DIAGNOSIS — Z8673 Personal history of transient ischemic attack (TIA), and cerebral infarction without residual deficits: Secondary | ICD-10-CM | POA: Diagnosis not present

## 2022-09-10 DIAGNOSIS — I2510 Atherosclerotic heart disease of native coronary artery without angina pectoris: Secondary | ICD-10-CM | POA: Diagnosis not present

## 2022-09-22 ENCOUNTER — Ambulatory Visit (INDEPENDENT_AMBULATORY_CARE_PROVIDER_SITE_OTHER): Payer: Medicare HMO | Admitting: Family Medicine

## 2022-09-22 ENCOUNTER — Encounter: Payer: Self-pay | Admitting: Family Medicine

## 2022-09-22 VITALS — BP 140/60 | HR 71 | Temp 98.3°F | Wt 191.0 lb

## 2022-09-22 DIAGNOSIS — N138 Other obstructive and reflux uropathy: Secondary | ICD-10-CM | POA: Diagnosis not present

## 2022-09-22 DIAGNOSIS — N401 Enlarged prostate with lower urinary tract symptoms: Secondary | ICD-10-CM | POA: Diagnosis not present

## 2022-09-22 DIAGNOSIS — B351 Tinea unguium: Secondary | ICD-10-CM

## 2022-09-22 LAB — POC URINALSYSI DIPSTICK (AUTOMATED)
Bilirubin, UA: NEGATIVE
Blood, UA: NEGATIVE
Glucose, UA: NEGATIVE
Ketones, UA: NEGATIVE
Leukocytes, UA: NEGATIVE
Nitrite, UA: NEGATIVE
Protein, UA: POSITIVE — AB
Spec Grav, UA: 1.015 (ref 1.010–1.025)
Urobilinogen, UA: 0.2 E.U./dL
pH, UA: 6 (ref 5.0–8.0)

## 2022-09-22 MED ORDER — TERBINAFINE HCL 250 MG PO TABS: 250.0000 mg | ORAL_TABLET | Freq: Every day | ORAL | 0 refills | Status: DC

## 2022-09-22 MED ORDER — TAMSULOSIN HCL 0.4 MG PO CAPS
0.4000 mg | ORAL_CAPSULE | Freq: Every day | ORAL | 0 refills | Status: DC
Start: 2022-09-22 — End: 2022-12-21

## 2022-09-22 NOTE — Progress Notes (Signed)
   Subjective:    Patient ID: Eugene Daniel, male    DOB: 04-07-1951, 72 y.o.   MRN: [id number]  HPI Here for one week of a slight slowing of his urine stream and getting up 4 times a night to urinate. No fever or pain. No burning. He also wants something to treat toenail fungus.   Review of Systems  Constitutional: Negative.   Respiratory: Negative.    Cardiovascular: Negative.   Gastrointestinal: Negative.   Genitourinary:  Positive for difficulty urinating and frequency. Negative for dysuria, flank pain, hematuria, testicular pain and urgency.       Objective:   Physical Exam Constitutional:      Appearance: Normal appearance. He is not ill-appearing.  Cardiovascular:     Rate and Rhythm: Normal rate and regular rhythm.     Pulses: Normal pulses.     Heart sounds: Normal heart sounds.  Pulmonary:     Effort: Pulmonary effort is normal.     Breath sounds: Normal breath sounds.  Abdominal:     General: Abdomen is flat. Bowel sounds are normal. There is no distension.     Palpations: Abdomen is soft. There is no mass.     Tenderness: There is no abdominal tenderness. There is no right CVA tenderness, left CVA tenderness, guarding or rebound.     Hernia: No hernia is present.  Genitourinary:    Testes: Normal.     Comments: Prostate is enlarged but smooth and not tender  Neurological:     Mental Status: He is alert.           Assessment & Plan:  BPH, he will try Flomax 0.4 mg daily. For the toenail fungus, he will try Terbinafine daily.  Gershon Crane, MD

## 2022-09-22 NOTE — Addendum Note (Signed)
Addended by: Carola Rhine on: 09/22/2022 04:46 PM   Modules accepted: Orders

## 2022-09-23 ENCOUNTER — Ambulatory Visit: Payer: Medicare HMO | Admitting: Family Medicine

## 2022-10-05 DIAGNOSIS — H5213 Myopia, bilateral: Secondary | ICD-10-CM | POA: Diagnosis not present

## 2022-10-07 DIAGNOSIS — Z0101 Encounter for examination of eyes and vision with abnormal findings: Secondary | ICD-10-CM | POA: Diagnosis not present

## 2022-11-07 ENCOUNTER — Other Ambulatory Visit: Payer: Self-pay | Admitting: Family Medicine

## 2022-11-08 ENCOUNTER — Other Ambulatory Visit: Payer: Self-pay | Admitting: Family Medicine

## 2022-11-23 ENCOUNTER — Other Ambulatory Visit: Payer: Self-pay | Admitting: Family Medicine

## 2022-12-18 ENCOUNTER — Other Ambulatory Visit: Payer: Self-pay | Admitting: Family Medicine

## 2023-03-18 ENCOUNTER — Other Ambulatory Visit: Payer: Self-pay | Admitting: Family Medicine

## 2023-04-05 ENCOUNTER — Telehealth: Payer: Self-pay | Admitting: Family Medicine

## 2023-04-05 MED ORDER — ATORVASTATIN CALCIUM 40 MG PO TABS: ORAL_TABLET | ORAL | 0 refills | Status: DC

## 2023-04-05 MED ORDER — LOSARTAN POTASSIUM 100 MG PO TABS: 100.0000 mg | ORAL_TABLET | Freq: Every day | ORAL | 0 refills | Status: DC

## 2023-04-05 NOTE — Telephone Encounter (Signed)
Prescription Request  04/05/2023  LOV: Visit date not found  What is the name of the medication or equipment? Losartan and Atorvastatin. Pt's wife states these prescriptions used to be filled for 90 tablets, but this last time they were filled for 30 tablets. She would like to know if they can be refilled with the 90 tablets.   Have you contacted your pharmacy to request a refill? No   Which pharmacy would you like this sent to?  [id number] - [place], [place] - [address] [place] [address] Phone: [phone] Fax: [phone]    Patient notified that their request is being sent to the clinical staff for review and that they should receive a response within 2 business days.   Please advise at Mobile [phone] (mobile)

## 2023-04-05 NOTE — Telephone Encounter (Signed)
Noted  

## 2023-04-05 NOTE — Telephone Encounter (Signed)
Rx sent patient needs to schedule appt with PCP for 90 day supply as he has not been seen in over 1 year

## 2023-04-05 NOTE — Telephone Encounter (Signed)
Scheduled Pt for Wednesday, 04/07/23 at 1:30 pm

## 2023-04-07 ENCOUNTER — Encounter: Payer: Self-pay | Admitting: Family Medicine

## 2023-04-07 ENCOUNTER — Ambulatory Visit (INDEPENDENT_AMBULATORY_CARE_PROVIDER_SITE_OTHER): Payer: Medicare HMO | Admitting: Family Medicine

## 2023-04-07 VITALS — BP 130/66 | HR 70 | Temp 98.4°F | Ht 72.0 in | Wt 198.7 lb

## 2023-04-07 DIAGNOSIS — R6889 Other general symptoms and signs: Secondary | ICD-10-CM

## 2023-04-07 DIAGNOSIS — E785 Hyperlipidemia, unspecified: Secondary | ICD-10-CM | POA: Diagnosis not present

## 2023-04-07 DIAGNOSIS — I10 Essential (primary) hypertension: Secondary | ICD-10-CM

## 2023-04-07 DIAGNOSIS — R5383 Other fatigue: Secondary | ICD-10-CM

## 2023-04-07 DIAGNOSIS — I358 Other nonrheumatic aortic valve disorders: Secondary | ICD-10-CM | POA: Diagnosis not present

## 2023-04-07 DIAGNOSIS — R739 Hyperglycemia, unspecified: Secondary | ICD-10-CM

## 2023-04-07 DIAGNOSIS — D649 Anemia, unspecified: Secondary | ICD-10-CM

## 2023-04-07 LAB — LIPID PANEL
Cholesterol: 84 mg/dL (ref 0–200)
HDL: 31.8 mg/dL — ABNORMAL LOW (ref >39.00)
LDL Cholesterol: 37 mg/dL (ref 0–99)
NonHDL: 52.2
Total CHOL/HDL Ratio: 3
Triglycerides: 76 mg/dL (ref 0.0–149.0)
VLDL: 15.2 mg/dL (ref 0.0–40.0)

## 2023-04-07 LAB — CBC WITH DIFFERENTIAL/PLATELET
Basophils Absolute: 0.1 10*3/uL (ref 0.0–0.1)
Basophils Relative: 1.2 % (ref 0.0–3.0)
Eosinophils Absolute: 0.3 10*3/uL (ref 0.0–0.7)
Eosinophils Relative: 3.7 % (ref 0.0–5.0)
HCT: 29.1 % — ABNORMAL LOW (ref 39.0–52.0)
Hemoglobin: 9.1 g/dL — ABNORMAL LOW (ref 13.0–17.0)
Lymphocytes Relative: 23 % (ref 12.0–46.0)
Lymphs Abs: 1.9 10*3/uL (ref 0.7–4.0)
MCHC: 31.2 g/dL (ref 30.0–36.0)
MCV: 76.1 fL — ABNORMAL LOW (ref 78.0–100.0)
Monocytes Absolute: 0.6 10*3/uL (ref 0.1–1.0)
Monocytes Relative: 7.7 % (ref 3.0–12.0)
Neutro Abs: 5.4 10*3/uL (ref 1.4–7.7)
Neutrophils Relative %: 64.4 % (ref 43.0–77.0)
Platelets: 257 10*3/uL (ref 150.0–400.0)
RBC: 3.82 Mil/uL — ABNORMAL LOW (ref 4.22–5.81)
RDW: 15.6 % — ABNORMAL HIGH (ref 11.5–15.5)
WBC: 8.3 10*3/uL (ref 4.0–10.5)

## 2023-04-07 LAB — COMPREHENSIVE METABOLIC PANEL
ALT: 17 U/L (ref 0–53)
AST: 15 U/L (ref 0–37)
Albumin: 4.3 g/dL (ref 3.5–5.2)
Alkaline Phosphatase: 59 U/L (ref 39–117)
BUN: 31 mg/dL — ABNORMAL HIGH (ref 6–23)
CO2: 28 meq/L (ref 19–32)
Calcium: 9.3 mg/dL (ref 8.4–10.5)
Chloride: 108 meq/L (ref 96–112)
Creatinine, Ser: 1.73 mg/dL — ABNORMAL HIGH (ref 0.40–1.50)
GFR: 39.56 mL/min — ABNORMAL LOW (ref >60.00)
Glucose, Bld: 95 mg/dL (ref 70–99)
Potassium: 5 meq/L (ref 3.5–5.1)
Sodium: 141 meq/L (ref 135–145)
Total Bilirubin: 0.3 mg/dL (ref 0.2–1.2)
Total Protein: 7 g/dL (ref 6.0–8.3)

## 2023-04-07 LAB — HEMOGLOBIN A1C: Hgb A1c MFr Bld: 6.6 % — ABNORMAL HIGH (ref 4.6–6.5)

## 2023-04-07 MED ORDER — LOSARTAN POTASSIUM 100 MG PO TABS: 100.0000 mg | ORAL_TABLET | Freq: Every day | ORAL | 3 refills | Status: DC

## 2023-04-07 MED ORDER — METOPROLOL SUCCINATE ER 50 MG PO TB24: ORAL_TABLET | ORAL | 3 refills | Status: DC

## 2023-04-07 MED ORDER — AMLODIPINE BESYLATE 10 MG PO TABS: 10.0000 mg | ORAL_TABLET | Freq: Every day | ORAL | 3 refills | Status: DC

## 2023-04-07 MED ORDER — ATORVASTATIN CALCIUM 40 MG PO TABS: ORAL_TABLET | ORAL | 3 refills | Status: DC

## 2023-04-07 NOTE — Progress Notes (Signed)
Established Patient Office Visit  Subjective   Patient ID: Eugene Daniel, male    DOB: 03/27/1951  Age: 72 y.o. MRN: [id number]  Chief Complaint  Patient presents with   Medication Refill    HPI   Eugene Daniel is seen for follow-up today.  He has history of hypertension, hyperlipidemia, past history of nicotine use, aortic insufficiency.  Last echo was almost 9 years ago.  He is generally doing well.  Retired from [employer] about 8 years ago.  He has no specific complaints today.  His medications are reviewed and include Flomax for BPH.  He was started on this last spring and has seen improvement in obstructive urinary symptoms.  Occasional lightheadedness which she thinks may be attributed to the medication.  He also takes losartan, amlodipine, atorvastatin, aspirin, metoprolol.  No recent chest pains.  No recent syncope.  No dyspnea with day-to-day activities.  He plans to get flu vaccine at local pharmacy in the next week or 2  He has had prior history of prediabetes range blood sugars.  No polyuria or polydipsia.  Last A1c 6.1%.  Past Medical History:  Diagnosis Date   Cataract    CORONARY ARTERY DISEASE    Cath 2005. LAD 75% stenosis in a small vessel. EF 45-50%.   ELEVATED PROSTATE SPECIFIC ANTIGEN 01/01/2009   HYPERLIPIDEMIA 01/01/2009   Kidney stone    Past Surgical History:  Procedure Laterality Date   EYE SURGERY     cataracts   TONSILLECTOMY      reports that he has been smoking cigarettes. He has never used smokeless tobacco. He reports current alcohol use. He reports that he does not use drugs. family history includes Colon cancer in his father; Heart disease in his paternal grandfather; Leukemia in his paternal grandfather. No Known Allergies  Review of Systems  Constitutional:  Negative for malaise/fatigue.  Eyes:  Negative for blurred vision.  Respiratory:  Negative for shortness of breath.   Cardiovascular:  Negative for chest pain.  Neurological:   Negative for dizziness, weakness and headaches.      Objective:     BP 130/66 (BP Location: Left Arm, Patient Position: Sitting, Cuff Size: Normal)   Pulse 70   Temp 98.4 F (36.9 C) (Oral)   Ht 6' (1.829 m)   Wt 198 lb 11.2 oz (90.1 kg)   SpO2 100%   BMI 26.95 kg/m  BP Readings from Last 3 Encounters:  04/07/23 130/66  09/22/22 (!) 140/60  09/19/21 120/60   Wt Readings from Last 3 Encounters:  04/07/23 198 lb 11.2 oz (90.1 kg)  09/22/22 191 lb (86.6 kg)  08/24/22 195 lb (88.5 kg)      Physical Exam Vitals reviewed.  Constitutional:      Appearance: He is well-developed.  HENT:     Right Ear: External ear normal.     Left Ear: External ear normal.  Eyes:     Pupils: Pupils are equal, round, and reactive to light.  Neck:     Thyroid: No thyromegaly.  Cardiovascular:     Rate and Rhythm: Normal rate and regular rhythm.     Heart sounds: Murmur heard.     Comments: He has systolic murmur over aortic valve 3/6 intensity Pulmonary:     Effort: Pulmonary effort is normal. No respiratory distress.     Breath sounds: Normal breath sounds. No wheezing or rales.  Musculoskeletal:     Cervical back: Neck supple.  Neurological:     Mental  Status: He is alert and oriented to person, place, and time.      No results found for any visits on 04/07/23.  Last CBC Lab Results  Component Value Date   WBC 9.7 02/06/2019   HGB 15.8 02/06/2019   HCT 45.9 02/06/2019   MCV 90.8 02/06/2019   RDW 13.2 02/06/2019   PLT 165.0 02/06/2019   Last metabolic panel Lab Results  Component Value Date   GLUCOSE 107 (H) 09/19/2021   NA 140 09/19/2021   K 4.0 09/19/2021   CL 105 09/19/2021   CO2 28 09/19/2021   BUN 29 (H) 09/19/2021   CREATININE 1.27 09/19/2021   GFR 57.95 (L) 09/19/2021   CALCIUM 9.5 09/19/2021   PROT 7.0 09/19/2021   ALBUMIN 4.3 09/19/2021   BILITOT 0.7 09/19/2021   ALKPHOS 64 09/19/2021   AST 25 09/19/2021   ALT 20 09/19/2021   Last lipids Lab Results   Component Value Date   CHOL 95 09/19/2021   HDL 29.80 (L) 09/19/2021   LDLCALC 50 09/19/2021   LDLDIRECT 90.9 02/10/2012   TRIG 78.0 09/19/2021   CHOLHDL 3 09/19/2021   Last hemoglobin A1c Lab Results  Component Value Date   HGBA1C 6.1 09/19/2021   Last thyroid functions Lab Results  Component Value Date   TSH 2.44 02/06/2019      The ASCVD Risk score (Arnett DK, et al., 2019) failed to calculate for the following reasons:   The valid total cholesterol range is 130 to 320 mg/dL    Assessment & Plan:   Problem List Items Addressed This Visit       Unprioritized   Essential hypertension   Relevant Medications   metoprolol succinate (TOPROL-XL) 50 MG 24 hr tablet   losartan (COZAAR) 100 MG tablet   atorvastatin (LIPITOR) 40 MG tablet   amLODipine (NORVASC) 10 MG tablet   Hyperlipidemia - Primary   Relevant Medications   metoprolol succinate (TOPROL-XL) 50 MG 24 hr tablet   losartan (COZAAR) 100 MG tablet   atorvastatin (LIPITOR) 40 MG tablet   amLODipine (NORVASC) 10 MG tablet   Other Relevant Orders   CMP   Lipid panel   Other Visit Diagnoses     Hyperglycemia       Relevant Orders   Hemoglobin A1c   Fatigue, unspecified type       Relevant Orders   CBC with Differential/Platelet   Aortic heart murmur       Relevant Orders   ECHOCARDIOGRAM COMPLETE     He has hypertension which is stable on multidrug regimen as above.  Recheck labs including comprehensive metabolic panel.  Continue low-sodium diet  -Set up repeat echocardiogram to evaluate aortic insufficiency murmur.  Has been years since prior echo.  Occasional lightheadedness but no other acute symptoms  -Recheck labs as above including CBC, CMP, lipid panel, A1c  -Refilled all of his regular medications for 1 year  -Discussed flu vaccine he plans to get at local pharmacy  -He had Cologuard last year which was negative.  Consider repeat in 2 years  No follow-ups on file.    Evelena Peat,  MD

## 2023-04-07 NOTE — Patient Instructions (Signed)
We will be setting up echocardiogram to further evaluate heart murmur.

## 2023-04-08 NOTE — Addendum Note (Signed)
Addended by: Christy Sartorius on: 04/08/2023 04:34 PM   Modules accepted: Orders

## 2023-04-12 ENCOUNTER — Other Ambulatory Visit: Payer: Medicare HMO

## 2023-04-12 DIAGNOSIS — D649 Anemia, unspecified: Secondary | ICD-10-CM | POA: Diagnosis not present

## 2023-04-12 DIAGNOSIS — E611 Iron deficiency: Secondary | ICD-10-CM

## 2023-04-12 LAB — CBC WITH DIFFERENTIAL/PLATELET
Basophils Absolute: 0.1 10*3/uL (ref 0.0–0.1)
Basophils Relative: 0.7 % (ref 0.0–3.0)
Eosinophils Absolute: 0.1 10*3/uL (ref 0.0–0.7)
Eosinophils Relative: 1.6 % (ref 0.0–5.0)
HCT: 28.9 % — ABNORMAL LOW (ref 39.0–52.0)
Hemoglobin: 9 g/dL — ABNORMAL LOW (ref 13.0–17.0)
Lymphocytes Relative: 20.4 % (ref 12.0–46.0)
Lymphs Abs: 1.5 10*3/uL (ref 0.7–4.0)
MCHC: 31.2 g/dL (ref 30.0–36.0)
MCV: 75.9 fL — ABNORMAL LOW (ref 78.0–100.0)
Monocytes Absolute: 0.6 10*3/uL (ref 0.1–1.0)
Monocytes Relative: 7.7 % (ref 3.0–12.0)
Neutro Abs: 5.3 10*3/uL (ref 1.4–7.7)
Neutrophils Relative %: 69.6 % (ref 43.0–77.0)
Platelets: 242 10*3/uL (ref 150.0–400.0)
RBC: 3.8 Mil/uL — ABNORMAL LOW (ref 4.22–5.81)
RDW: 16 % — ABNORMAL HIGH (ref 11.5–15.5)
WBC: 7.6 10*3/uL (ref 4.0–10.5)

## 2023-04-12 LAB — BASIC METABOLIC PANEL
BUN: 34 mg/dL — ABNORMAL HIGH (ref 6–23)
CO2: 22 meq/L (ref 19–32)
Calcium: 9 mg/dL (ref 8.4–10.5)
Chloride: 107 meq/L (ref 96–112)
Creatinine, Ser: 1.52 mg/dL — ABNORMAL HIGH (ref 0.40–1.50)
GFR: 46.2 mL/min — ABNORMAL LOW (ref >60.00)
Glucose, Bld: 98 mg/dL (ref 70–99)
Potassium: 4.6 meq/L (ref 3.5–5.1)
Sodium: 138 meq/L (ref 135–145)

## 2023-04-13 LAB — IRON,TIBC AND FERRITIN PANEL
%SAT: 5 % — ABNORMAL LOW (ref 20–48)
Ferritin: 5 ng/mL — ABNORMAL LOW (ref 24–380)
Iron: 19 ug/dL — ABNORMAL LOW (ref 50–180)
TIBC: 399 ug/dL (ref 250–425)

## 2023-04-19 ENCOUNTER — Ambulatory Visit: Payer: Medicare HMO | Admitting: Physician Assistant

## 2023-05-03 ENCOUNTER — Ambulatory Visit (HOSPITAL_BASED_OUTPATIENT_CLINIC_OR_DEPARTMENT_OTHER): Payer: Medicare HMO

## 2023-05-03 DIAGNOSIS — I358 Other nonrheumatic aortic valve disorders: Secondary | ICD-10-CM

## 2023-05-03 DIAGNOSIS — R011 Cardiac murmur, unspecified: Secondary | ICD-10-CM

## 2023-05-04 LAB — ECHOCARDIOGRAM COMPLETE
AR max vel: 1.55 cm2
AV Area VTI: 1.74 cm2
AV Area mean vel: 1.49 cm2
AV Mean grad: 12 mm[Hg]
AV Peak grad: 20.8 mm[Hg]
Ao pk vel: 2.28 m/s
Area-P 1/2: 5.27 cm2
MV M vel: 4.82 m/s
MV Peak grad: 92.9 mm[Hg]
P 1/2 time: 820 ms
S' Lateral: 2.92 cm

## 2023-05-06 ENCOUNTER — Telehealth: Payer: Self-pay | Admitting: Family Medicine

## 2023-05-06 NOTE — Telephone Encounter (Signed)
Please see result note 

## 2023-05-06 NOTE — Addendum Note (Signed)
Addended by: Christy Sartorius on: 05/06/2023 10:51 AM   Modules accepted: Orders

## 2023-05-06 NOTE — Telephone Encounter (Signed)
Pt called, returning CMA's call. CMA was with a patient. Pt asked that CMA call back at his earliest convenience. 

## 2023-06-25 ENCOUNTER — Other Ambulatory Visit: Payer: Self-pay | Admitting: Family Medicine

## 2023-07-14 NOTE — Progress Notes (Signed)
 Referring-Bruce Burchett, MD Reason for referral-bicuspid aortic valve/aortic stenosis/aortic insufficiency  HPI: 73 year old male for evaluation of bicuspid aortic valve/aortic stenosis/aortic insufficiency at request of Edwin Cap, MD. Previously seen by Dr. Antoine Poche but not since 2015.  Apparently with history of coronary artery disease/myocardial infarction but no records available.  Abdominal ultrasound October 2020 showed maximal diameter 3.2 cm and follow-up recommended in 3 years.  Echocardiogram December 2024 showed ejection fraction 55 to 60%, moderate left ventricular hypertrophy, grade 2 diastolic dysfunction, hypokinesis of the mid apical anteroseptal wall, moderate left atrial enlargement, mild mitral regurgitation, bicuspid aortic valve with moderate aortic insufficiency and mild aortic stenosis (mean gradient 12 mmHg) and borderline dilatation of the acing aorta at 38 mm.  Cardiology now asked to evaluate.  Patient states he has some dyspnea on exertion and fatigue after activities worse compared to the past.  No orthopnea, PND, pedal edema, chest pain or syncope.  Current Outpatient Medications  Medication Sig Dispense Refill   amLODipine (NORVASC) 10 MG tablet Take 1 tablet (10 mg total) by mouth daily. 90 tablet 3   aspirin 81 MG EC tablet Take by mouth.     atorvastatin (LIPITOR) 40 MG tablet TAKE 1 TABLET BY MOUTH DAILY AT 6 PM. 90 tablet 3   Bromfenac Sodium (PROLENSA) 0.07 % SOLN      losartan (COZAAR) 100 MG tablet Take 1 tablet (100 mg total) by mouth daily. 90 tablet 3   metoprolol succinate (TOPROL-XL) 50 MG 24 hr tablet TAKE 1 TABLET (50 MG TOTAL) BY MOUTH DAILY OR IMMEDIATELY FOLLOWING A MEAL. 90 tablet 3   Multiple Vitamin (MULTIVITAMIN) tablet Take 1 tablet by mouth daily.     PROLENSA 0.07 % SOLN      tamsulosin (FLOMAX) 0.4 MG CAPS capsule TAKE 1 CAPSULE BY MOUTH EVERY DAY 90 capsule 1   No current facility-administered medications for this visit.     No Known Allergies   Past Medical History:  Diagnosis Date   Bicuspid aortic valve    Cataract    CORONARY ARTERY DISEASE    Cath 2005. LAD 75% stenosis in a small vessel. EF 45-50%.   ELEVATED PROSTATE SPECIFIC ANTIGEN 01/01/2009   HYPERLIPIDEMIA 01/01/2009   Hypertension    Kidney stone     Past Surgical History:  Procedure Laterality Date   EYE SURGERY     cataracts   TONSILLECTOMY      Social History   Socioeconomic History   Marital status: Married    Spouse name: Not on file   Number of children: 4   Years of education: Not on file   Highest education level: Not on file  Occupational History   Occupation: retired  Tobacco Use   Smoking status: Former    Current packs/day: 0.00    Types: Cigarettes    Quit date: 04/06/2014    Years since quitting: 9.3   Smokeless tobacco: Never   Tobacco comments:    2 cigs per day.    Vaping Use   Vaping status: Never Used  Substance and Sexual Activity   Alcohol use: Yes    Comment: occasional wine and beer   Drug use: No   Sexual activity: Yes    Partners: Female    Comment: married  Other Topics Concern   Not on file  Social History Narrative   Not on file   Social Drivers of Health   Financial Resource Strain: Low Risk  (08/24/2022)   Overall Financial Resource  Strain (CARDIA)    Difficulty of Paying Living Expenses: Not hard at all  Food Insecurity: No Food Insecurity (08/24/2022)   Hunger Vital Sign    Worried About Running Out of Food in the Last Year: Never true    Ran Out of Food in the Last Year: Never true  Transportation Needs: No Transportation Needs (08/24/2022)   PRAPARE - Administrator, Civil Service (Medical): No    Lack of Transportation (Non-Medical): No  Physical Activity: Inactive (08/24/2022)   Exercise Vital Sign    Days of Exercise per Week: 0 days    Minutes of Exercise per Session: 0 min  Stress: No Stress Concern Present (08/24/2022)   [employer] of Occupational  Health - Occupational Stress Questionnaire    Feeling of Stress : Not at all  Social Connections: Unknown (09/27/2021)   Received from [facility]   Social Network    Social Network: Not on file  Intimate Partner Violence: Unknown (08/25/2021)   Received from [facility], [facility]   HITS    Physically Hurt: Not on file    Insult or Talk Down To: Not on file    Threaten Physical Harm: Not on file    Scream or Curse: Not on file    Family History  Problem Relation Age of Onset   Colon cancer Father    Heart disease Paternal Grandfather    Leukemia Paternal Grandfather    Stomach cancer Neg Hx    Rectal cancer Neg Hx     ROS: no fevers or chills, productive cough, hemoptysis, dysphasia, odynophagia, melena, hematochezia, dysuria, hematuria, rash, seizure activity, orthopnea, PND, pedal edema, claudication. Remaining systems are negative.  Physical Exam:   Blood pressure 127/71, pulse 60, height 6\' 1"  (1.854 m), weight 202 lb 3.2 oz (91.7 kg), SpO2 96%.  General:  Well developed/well nourished in NAD Skin warm/dry Patient not depressed No peripheral clubbing Back-normal HEENT-normal/normal eyelids Neck supple/normal carotid upstroke bilaterally; no bruits; no JVD; no thyromegaly chest - CTA/ normal expansion CV - RRR/normal S1 and S2; no rubs or gallops;  PMI nondisplaced; 2/6 systolic murmur left sternal border. Abdomen -NT/ND, no HSM, no mass, + bowel sounds, no bruit 2+ femoral pulses, no bruits Ext-no edema, chords, 2+ DP Neuro-grossly nonfocal  EKG Interpretation Date/Time:  Monday July 26 2023 11:10:10 EST Ventricular Rate:  60 PR Interval:  234 QRS Duration:  82 QT Interval:  402 QTC Calculation: 402 R Axis:   9  Text Interpretation: Sinus rhythm with 1st degree A-V block No previous ECGs available Confirmed by Olga Millers ([id number]) on 07/26/2023 11:45:36 AM    A/P  1 bicuspid aortic valve with aortic stenosis/aortic  insufficiency-patient is noted to have a bicuspid aortic valve with mild aortic stenosis and moderate aortic insufficiency.  I do not think he is having symptoms at present.  We discussed the symptoms to be aware of including dyspnea, chest pain and syncope.  He understands he will likely require aortic valve replacement in the future.  Will plan repeat echocardiogram December 2025.  2 history of coronary artery disease-continue aspirin and statin.  He is describing some fatigue and dyspnea with activities.  Schedule stress nuclear study to screen for ischemia.  3 dilated abdominal aorta-will repeat abdominal ultrasound.  4 hyperlipidemia-continue Lipitor.  5 hypertension-patient's blood pressure is controlled.  Continue present medications.  Olga Millers, MD

## 2023-07-26 ENCOUNTER — Ambulatory Visit: Payer: Medicare HMO | Attending: Cardiology | Admitting: Cardiology

## 2023-07-26 ENCOUNTER — Encounter: Payer: Self-pay | Admitting: Cardiology

## 2023-07-26 VITALS — BP 127/71 | HR 60 | Ht 73.0 in | Wt 202.2 lb

## 2023-07-26 DIAGNOSIS — I351 Nonrheumatic aortic (valve) insufficiency: Secondary | ICD-10-CM | POA: Diagnosis not present

## 2023-07-26 DIAGNOSIS — R011 Cardiac murmur, unspecified: Secondary | ICD-10-CM

## 2023-07-26 DIAGNOSIS — R0609 Other forms of dyspnea: Secondary | ICD-10-CM

## 2023-07-26 DIAGNOSIS — R0989 Other specified symptoms and signs involving the circulatory and respiratory systems: Secondary | ICD-10-CM | POA: Diagnosis not present

## 2023-07-26 NOTE — Patient Instructions (Signed)
    Testing/Procedures:  Your physician has requested that you have en exercise stress myoview. For further information please visit [email or web address] Please follow instruction sheet, as given. [address] [facility]  Your physician has requested that you have an abdominal aorta duplex. During this test, an ultrasound is used to evaluate the aorta. Allow 30 minutes for this exam. Do not eat after midnight the day before and avoid carbonated beverages.  Please note: We ask at that you not bring children with you during ultrasound (echo/ vascular) testing. Due to room size and safety concerns, children are not allowed in the ultrasound rooms during exams. Our front office staff cannot provide observation of children in our lobby area while testing is being conducted. An adult accompanying a patient to their appointment will only be allowed in the ultrasound room at the discretion of the ultrasound technician under special circumstances. We apologize for any inconvenience. [facility]   Follow-Up: At [facility], you and your health needs are our priority.  As part of our continuing mission to provide you with exceptional heart care, we have created designated Provider Care Teams.  These Care Teams include your primary Cardiologist (physician) and Advanced Practice Providers (APPs -  Physician Assistants and Nurse Practitioners) who all work together to provide you with the care you need, when you need it.    Your next appointment:   6 month(s)  Provider:   Olga Millers MD

## 2023-08-04 ENCOUNTER — Telehealth (HOSPITAL_COMMUNITY): Payer: Self-pay | Admitting: *Deleted

## 2023-08-04 NOTE — Telephone Encounter (Signed)
 Patient given detailed instructions per Myocardial Perfusion Study Information Sheet for the test on 08/09/2023 at 10:45. Patient notified to arrive 15 minutes early and that it is imperative to arrive on time for appointment to keep from having the test rescheduled.  If you need to cancel or reschedule your appointment, please call the office within 24 hours of your appointment. . Patient verbalized understanding.Eugene Daniel

## 2023-08-09 ENCOUNTER — Encounter (HOSPITAL_COMMUNITY): Payer: Self-pay

## 2023-08-09 ENCOUNTER — Ambulatory Visit (HOSPITAL_COMMUNITY)

## 2023-08-11 ENCOUNTER — Encounter: Payer: Self-pay | Admitting: *Deleted

## 2023-08-11 ENCOUNTER — Ambulatory Visit (HOSPITAL_COMMUNITY): Attending: Cardiology

## 2023-08-11 DIAGNOSIS — R0989 Other specified symptoms and signs involving the circulatory and respiratory systems: Secondary | ICD-10-CM | POA: Diagnosis not present

## 2023-08-11 DIAGNOSIS — R011 Cardiac murmur, unspecified: Secondary | ICD-10-CM | POA: Diagnosis not present

## 2023-08-11 DIAGNOSIS — R0609 Other forms of dyspnea: Secondary | ICD-10-CM | POA: Diagnosis not present

## 2023-08-11 LAB — MYOCARDIAL PERFUSION IMAGING
Base ST Depression (mm): 0 mm
LV dias vol: 153 mL (ref 62–150)
LV sys vol: 74 mL
Nuc Stress EF: 52 %
Peak HR: 114 {beats}/min
Rest HR: 67 {beats}/min
Rest Nuclear Isotope Dose: 8.3 mCi
SDS: 1
SRS: 7
SSS: 9
ST Depression (mm): 0 mm
Stress Nuclear Isotope Dose: 29.7 mCi
TID: 0.98

## 2023-08-11 MED ORDER — TECHNETIUM TC 99M TETROFOSMIN IV KIT
8.3000 | PACK | Freq: Once | INTRAVENOUS | Status: AC | PRN
  Administered 2023-08-11: 8.3 via INTRAVENOUS

## 2023-08-11 MED ORDER — REGADENOSON 0.4 MG/5ML IV SOLN
0.4000 mg | Freq: Once | INTRAVENOUS | Status: AC
  Administered 2023-08-11: 0.4 mg via INTRAVENOUS

## 2023-08-11 MED ORDER — TECHNETIUM TC 99M TETROFOSMIN IV KIT
29.7000 | PACK | Freq: Once | INTRAVENOUS | Status: AC | PRN
Start: 2023-08-11 — End: 2023-08-11
  Administered 2023-08-11: 29.7 via INTRAVENOUS

## 2023-08-20 ENCOUNTER — Ambulatory Visit (HOSPITAL_COMMUNITY)
Admission: RE | Admit: 2023-08-20 | Discharge: 2023-08-20 | Disposition: A | Discharge status: Home / Self Care | Source: Ambulatory Visit | Attending: Cardiology | Admitting: Cardiology

## 2023-08-20 DIAGNOSIS — R0989 Other specified symptoms and signs involving the circulatory and respiratory systems: Secondary | ICD-10-CM | POA: Diagnosis not present

## 2023-08-20 DIAGNOSIS — I10 Essential (primary) hypertension: Secondary | ICD-10-CM | POA: Insufficient documentation

## 2023-08-20 DIAGNOSIS — Z8489 Family history of other specified conditions: Secondary | ICD-10-CM

## 2023-08-20 DIAGNOSIS — Z136 Encounter for screening for cardiovascular disorders: Secondary | ICD-10-CM | POA: Insufficient documentation

## 2023-08-20 DIAGNOSIS — Z87891 Personal history of nicotine dependence: Secondary | ICD-10-CM | POA: Diagnosis not present

## 2023-08-20 DIAGNOSIS — E785 Hyperlipidemia, unspecified: Secondary | ICD-10-CM | POA: Diagnosis not present

## 2023-08-27 ENCOUNTER — Ambulatory Visit: Payer: Medicare HMO

## 2023-08-27 VITALS — Ht 73.0 in | Wt 198.0 lb

## 2023-08-27 DIAGNOSIS — Z0000 Encounter for general adult medical examination without abnormal findings: Secondary | ICD-10-CM

## 2023-08-27 NOTE — Progress Notes (Signed)
 Subjective:   Eugene Daniel is a 73 y.o. who presents for a Medicare Wellness preventive visit.  Visit Complete: Virtual I connected with  Blair Heys on 08/27/23 by a audio enabled telemedicine application and verified that I am speaking with the correct person using two identifiers.  Patient Location: Home  Provider Location: Home Office  I discussed the limitations of evaluation and management by telemedicine. The patient expressed understanding and agreed to proceed.  Vital Signs: Because this visit was a virtual/telehealth visit, some criteria may be missing or patient reported. Any vitals not documented were not able to be obtained and vitals that have been documented are patient reported.    Persons Participating in Visit: Patient.  AWV Questionnaire: No: Patient Medicare AWV questionnaire was not completed prior to this visit.  Cardiac Risk Factors include: advanced age (>24men, >63 women);male gender;hypertension     Objective:    Today's Vitals   08/27/23 1514  Weight: 198 lb (89.8 kg)  Height: 6\' 1"  (1.854 m)   Body mass index is 26.12 kg/m.     08/27/2023    3:22 PM 08/24/2022    3:06 PM 08/20/2021    8:28 AM 08/14/2020    8:15 AM  Advanced Directives  Does Patient Have a Medical Advance Directive? Yes Yes Yes Yes  Type of Estate agent of [place];Living will Healthcare Power of [place];Living will Living will Healthcare Power of [place];Living will  Does patient want to make changes to medical advance directive?   No - Patient declined   Copy of Healthcare Power of Attorney in Chart? No - copy requested No - copy requested  No - copy requested    Current Medications (verified) Outpatient Encounter Medications as of 08/27/2023  Medication Sig   amLODipine (NORVASC) 10 MG tablet Take 1 tablet (10 mg total) by mouth daily.   aspirin 81 MG EC tablet Take by mouth.   atorvastatin (LIPITOR) 40 MG tablet TAKE 1 TABLET BY MOUTH DAILY AT  6 PM.   Bromfenac Sodium (PROLENSA) 0.07 % SOLN    losartan (COZAAR) 100 MG tablet Take 1 tablet (100 mg total) by mouth daily.   metoprolol succinate (TOPROL-XL) 50 MG 24 hr tablet TAKE 1 TABLET (50 MG TOTAL) BY MOUTH DAILY OR IMMEDIATELY FOLLOWING A MEAL.   Multiple Vitamin (MULTIVITAMIN) tablet Take 1 tablet by mouth daily.   PROLENSA 0.07 % SOLN    tamsulosin (FLOMAX) 0.4 MG CAPS capsule TAKE 1 CAPSULE BY MOUTH EVERY DAY   No facility-administered encounter medications on file as of 08/27/2023.    Allergies (verified) Patient has no known allergies.   History: Past Medical History:  Diagnosis Date   Bicuspid aortic valve    Cataract    CORONARY ARTERY DISEASE    Cath 2005. LAD 75% stenosis in a small vessel. EF 45-50%.   ELEVATED PROSTATE SPECIFIC ANTIGEN 01/01/2009   HYPERLIPIDEMIA 01/01/2009   Hypertension    Kidney stone    Past Surgical History:  Procedure Laterality Date   EYE SURGERY     cataracts   TONSILLECTOMY     Family History  Problem Relation Age of Onset   Colon cancer Father    Heart disease Paternal Grandfather    Leukemia Paternal Grandfather    Stomach cancer Neg Hx    Rectal cancer Neg Hx    Social History   Socioeconomic History   Marital status: Married    Spouse name: Not on file   Number of  children: 4   Years of education: Not on file   Highest education level: Not on file  Occupational History   Occupation: retired  Tobacco Use   Smoking status: Former    Current packs/day: 0.00    Types: Cigarettes    Quit date: 04/06/2014    Years since quitting: 9.3   Smokeless tobacco: Never   Tobacco comments:    2 cigs per day.    Vaping Use   Vaping status: Never Used  Substance and Sexual Activity   Alcohol use: Yes    Comment: occasional wine and beer   Drug use: No   Sexual activity: Yes    Partners: Female    Comment: married  Other Topics Concern   Not on file  Social History Narrative   Not on file   Social Drivers of  Health   Financial Resource Strain: Low Risk  (08/27/2023)   Overall Financial Resource Strain (CARDIA)    Difficulty of Paying Living Expenses: Not hard at all  Food Insecurity: No Food Insecurity (08/27/2023)   Hunger Vital Sign    Worried About Running Out of Food in the Last Year: Never true    Ran Out of Food in the Last Year: Never true  Transportation Needs: No Transportation Needs (08/27/2023)   PRAPARE - Administrator, Civil Service (Medical): No    Lack of Transportation (Non-Medical): No  Physical Activity: Sufficiently Active (08/27/2023)   Exercise Vital Sign    Days of Exercise per Week: 7 days    Minutes of Exercise per Session: 30 min  Stress: No Stress Concern Present (08/27/2023)   [employer] of Occupational Health - Occupational Stress Questionnaire    Feeling of Stress : Not at all  Social Connections: Socially Integrated (08/27/2023)   Social Connection and Isolation Panel [NHANES]    Frequency of Communication with Friends and Family: More than three times a week    Frequency of Social Gatherings with Friends and Family: More than three times a week    Attends Religious Services: More than 4 times per year    Active Member of [employer] or Organizations: Yes    Attends Engineer, structural: More than 4 times per year    Marital Status: Married    Tobacco Counseling Counseling given: Not Answered Tobacco comments: 2 cigs per day.      Clinical Intake:  Pre-visit preparation completed: Yes  Pain : No/denies pain     BMI - recorded: 26.12 Nutritional Status: BMI 25 -29 Overweight Nutritional Risks: None Diabetes: No  Lab Results  Component Value Date   HGBA1C 6.6 (H) 04/07/2023   HGBA1C 6.1 09/19/2021   HGBA1C 6.2 02/06/2019     How often do you need to have someone help you when you read instructions, pamphlets, or other written materials from your doctor or pharmacy?: 1 - Never  Interpreter Needed?: No  Information entered by  :: Theresa Mulligan LPN   Activities of Daily Living     08/27/2023    3:20 PM  In your present state of health, do you have any difficulty performing the following activities:  Hearing? 0  Vision? 0  Difficulty concentrating or making decisions? 0  Walking or climbing stairs? 0  Dressing or bathing? 0  Doing errands, shopping? 0  Preparing Food and eating ? N  Using the Toilet? N  In the past six months, have you accidently leaked urine? N  Do you have problems with  loss of bowel control? N  Managing your Medications? N  Managing your Finances? N  Housekeeping or managing your Housekeeping? N    Patient Care Team: Kristian Covey, MD as PCP - General  Indicate any recent Medical Services you may have received from other than [facility] providers in the past year (date may be approximate).     Assessment:   This is a routine wellness examination for [place].  Hearing/Vision screen Hearing Screening - Comments:: Denies hearing difficulties   Vision Screening - Comments:: Wears rx glasses - up to date with routine eye exams with  [facility]   Goals Addressed               This Visit's Progress     Go back to work Part time (pt-stated)         Depression Screen     08/27/2023    3:19 PM 08/24/2022    3:06 PM 08/20/2021    8:22 AM 08/14/2020    8:13 AM 01/03/2018    3:06 PM 04/09/2015   11:32 AM  PHQ 2/9 Scores  PHQ - 2 Score 0 0 0 0 4 0    Fall Risk     08/27/2023    3:21 PM 08/24/2022    3:06 PM 08/20/2021    8:26 AM 08/14/2020    8:16 AM 01/03/2018    3:06 PM  Fall Risk   Falls in the past year? 1 0 0 0 No  Number falls in past yr: 0 0 0 0   Injury with Fall? 0 0 0 0   Risk for fall due to : No Fall Risks Medication side effect No Fall Risks    Follow up Falls prevention discussed;Falls evaluation completed Falls prevention discussed;Education provided;Falls evaluation completed  Falls prevention discussed     MEDICARE RISK AT HOME:  Medicare Risk at  Home Any stairs in or around the home?: Yes If so, are there any without handrails?: No Home free of loose throw rugs in walkways, pet beds, electrical cords, etc?: Yes Adequate lighting in your home to reduce risk of falls?: Yes Life alert?: No Use of a cane, walker or w/c?: No Grab bars in the bathroom?: Yes Shower chair or bench in shower?: No Elevated toilet seat or a handicapped toilet?: No  TIMED UP AND GO:  Was the test performed?  No  Cognitive Function: 6CIT completed        08/27/2023    3:23 PM 08/24/2022    3:07 PM 08/20/2021    8:28 AM 08/14/2020    8:19 AM  6CIT Screen  What Year? 0 points 0 points 0 points 0 points  What month? 0 points 0 points 0 points 0 points  What time? 0 points 3 points 0 points   Count back from 20 0 points 0 points 0 points 2 points  Months in reverse 0 points 0 points 0 points 0 points  Repeat phrase 0 points 2 points 0 points 0 points  Total Score 0 points 5 points 0 points     Immunizations Immunization History  Administered Date(s) Administered   Fluad Quad(high Dose 65+) 02/06/2019   Influenza,inj,Quad PF,6+ Mos 04/06/2014, 03/27/2015, 04/09/2015, 03/04/2016   PFIZER(Purple Top)SARS-COV-2 Vaccination 09/14/2019   Pneumococcal Conjugate-13 01/03/2018   Pneumococcal Polysaccharide-23 01/02/2010, 02/06/2019   Td 01/02/2010   Tdap 12/26/2019    Screening Tests Health Maintenance  Topic Date Due   Zoster Vaccines- Shingrix (1 of 2) Never  done   COVID-19 Vaccine (2 - 2024-25 season) 01/24/2023   Colonoscopy  04/03/2025 (Originally 03/17/2020)   INFLUENZA VACCINE  12/24/2023   Medicare Annual Wellness (AWV)  08/26/2024   DTaP/Tdap/Td (3 - Td or Tdap) 12/25/2029   Pneumonia Vaccine 37+ Years old  Completed   Hepatitis C Screening  Completed   HPV VACCINES  Aged Out    Health Maintenance  Health Maintenance Due  Topic Date Due   Zoster Vaccines- Shingrix (1 of 2) Never done   COVID-19 Vaccine (2 - 2024-25 season)  01/24/2023   Health Maintenance Items Addressed:    Additional Screening:  Vision Screening: Recommended annual ophthalmology exams for early detection of glaucoma and other disorders of the eye.  Dental Screening: Recommended annual dental exams for proper oral hygiene  Community Resource Referral / Chronic Care Management: CRR required this visit?  No   CCM required this visit?  No     Plan:     I have personally reviewed and noted the following in the patient's chart:   Medical and social history Use of alcohol, tobacco or illicit drugs  Current medications and supplements including opioid prescriptions. Patient is not currently taking opioid prescriptions. Functional ability and status Nutritional status Physical activity Advanced directives List of other physicians Hospitalizations, surgeries, and ER visits in previous 12 months Vitals Screenings to include cognitive, depression, and falls Referrals and appointments  In addition, I have reviewed and discussed with patient certain preventive protocols, quality metrics, and best practice recommendations. A written personalized care plan for preventive services as well as general preventive health recommendations were provided to patient.     Tillie Rung, LPN   05/30/1094   After Visit Summary: (MyChart) Due to this being a telephonic visit, the after visit summary with patients personalized plan was offered to patient via MyChart   Notes: Nothing significant to report at this time.

## 2023-08-27 NOTE — Patient Instructions (Addendum)
 Mr. Relph , Thank you for taking time to come for your Medicare Wellness Visit. I appreciate your ongoing commitment to your health goals. Please review the following plan we discussed and let me know if I can assist you in the future.   Referrals/Orders/Follow-Ups/Clinician Recommendations:   This is a list of the screening recommended for you and due dates:  Health Maintenance  Topic Date Due   Zoster (Shingles) Vaccine (1 of 2) Never done   COVID-19 Vaccine (2 - 2024-25 season) 01/24/2023   Colon Cancer Screening  04/03/2025*   Flu Shot  12/24/2023   Medicare Annual Wellness Visit  08/26/2024   DTaP/Tdap/Td vaccine (3 - Td or Tdap) 12/25/2029   Pneumonia Vaccine  Completed   Hepatitis C Screening  Completed   HPV Vaccine  Aged Out  *Topic was postponed. The date shown is not the original due date.    Advanced directives: (Copy Requested) Please bring a copy of your health care power of attorney and living will to the office to be added to your chart at your convenience. You can mail to [facility] [address] [place], [place] [address] or email to [email or web address]  Next Medicare Annual Wellness Visit scheduled for next year: Yes

## 2023-12-23 ENCOUNTER — Other Ambulatory Visit: Payer: Self-pay | Admitting: Family Medicine

## 2024-01-18 DIAGNOSIS — H66001 Acute suppurative otitis media without spontaneous rupture of ear drum, right ear: Secondary | ICD-10-CM | POA: Diagnosis not present

## 2024-02-07 DIAGNOSIS — H90A31 Mixed conductive and sensorineural hearing loss, unilateral, right ear with restricted hearing on the contralateral side: Secondary | ICD-10-CM | POA: Diagnosis not present

## 2024-02-07 DIAGNOSIS — H90A22 Sensorineural hearing loss, unilateral, left ear, with restricted hearing on the contralateral side: Secondary | ICD-10-CM | POA: Diagnosis not present

## 2024-02-07 DIAGNOSIS — H9313 Tinnitus, bilateral: Secondary | ICD-10-CM | POA: Diagnosis not present

## 2024-02-14 DIAGNOSIS — H9211 Otorrhea, right ear: Secondary | ICD-10-CM | POA: Diagnosis not present

## 2024-04-01 ENCOUNTER — Other Ambulatory Visit: Payer: Self-pay | Admitting: Family Medicine

## 2024-06-28 ENCOUNTER — Other Ambulatory Visit: Payer: Self-pay | Admitting: Family Medicine

## 2024-09-01 ENCOUNTER — Ambulatory Visit
